# Patient Record
Sex: Female | Born: 1985
Health system: Southern US, Community
[De-identification: ages and names within clinical notes are randomized; demographics above are authoritative.]

## PROBLEM LIST (undated history)

## (undated) DIAGNOSIS — Z789 Other specified health status: Secondary | ICD-10-CM

## (undated) DIAGNOSIS — A749 Chlamydial infection, unspecified: Secondary | ICD-10-CM

## (undated) DIAGNOSIS — N390 Urinary tract infection, site not specified: Secondary | ICD-10-CM

## (undated) HISTORY — PX: THERAPEUTIC ABORTION: SHX798

---
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
## 2006-04-14 ENCOUNTER — Emergency Department (HOSPITAL_COMMUNITY): Admission: EM | Admit: 2006-04-14 | Discharge: 2006-04-15 | Payer: Self-pay | Admitting: Emergency Medicine

## 2006-07-14 ENCOUNTER — Emergency Department (HOSPITAL_COMMUNITY): Admission: EM | Admit: 2006-07-14 | Discharge: 2006-07-14 | Payer: Self-pay | Admitting: *Deleted

## 2006-10-24 ENCOUNTER — Ambulatory Visit: Payer: Self-pay | Admitting: Obstetrics & Gynecology

## 2006-10-24 ENCOUNTER — Inpatient Hospital Stay (HOSPITAL_COMMUNITY): Admission: AD | Admit: 2006-10-24 | Discharge: 2006-10-26 | Payer: Self-pay | Admitting: Obstetrics & Gynecology

## 2008-10-09 ENCOUNTER — Emergency Department (HOSPITAL_COMMUNITY): Admission: EM | Admit: 2008-10-09 | Discharge: 2008-10-09 | Payer: Self-pay | Admitting: Family Medicine

## 2008-11-05 ENCOUNTER — Encounter: Payer: Self-pay | Admitting: Family Medicine

## 2008-11-05 ENCOUNTER — Inpatient Hospital Stay (HOSPITAL_COMMUNITY): Admission: AD | Admit: 2008-11-05 | Discharge: 2008-11-05 | Payer: Self-pay | Admitting: Family Medicine

## 2008-11-26 ENCOUNTER — Ambulatory Visit (HOSPITAL_COMMUNITY): Admission: RE | Admit: 2008-11-26 | Discharge: 2008-11-26 | Payer: Self-pay | Admitting: Family Medicine

## 2009-01-21 ENCOUNTER — Inpatient Hospital Stay (HOSPITAL_COMMUNITY): Admission: AD | Admit: 2009-01-21 | Discharge: 2009-01-21 | Payer: Self-pay | Admitting: Obstetrics and Gynecology

## 2009-01-21 ENCOUNTER — Ambulatory Visit: Payer: Self-pay | Admitting: Family Medicine

## 2009-01-22 ENCOUNTER — Ambulatory Visit: Payer: Self-pay | Admitting: Advanced Practice Midwife

## 2009-01-22 ENCOUNTER — Inpatient Hospital Stay (HOSPITAL_COMMUNITY): Admission: AD | Admit: 2009-01-22 | Discharge: 2009-01-24 | Payer: Self-pay | Admitting: Obstetrics & Gynecology

## 2010-01-29 ENCOUNTER — Emergency Department (HOSPITAL_COMMUNITY)
Admission: EM | Admit: 2010-01-29 | Discharge: 2010-01-29 | Payer: Self-pay | Source: Home / Self Care | Admitting: Emergency Medicine

## 2010-01-29 NOTE — L&D Delivery Note (Signed)
Delivery Note At 11:07 AM a viable female was delivered via Vaginal, Spontaneous Delivery (Presentation: Right Occiput Anterior).  APGAR: 9, 9; weight 8 lb 3.6 oz (3731 g).   Placenta status: Intact, Spontaneous.  Cord: 3 vessels with the following complications: None.  Cord pH: not done  Anesthesia: Epidural  Episiotomy: None Lacerations: None Suture Repair: 2.0 Est. Blood Loss (mL):   Mom to postpartum.  Baby to nursery-stable.  Minerva Bluett A 09/07/2010, 11:57 AM

## 2010-04-10 LAB — URINE MICROSCOPIC-ADD ON

## 2010-04-10 LAB — URINALYSIS, ROUTINE W REFLEX MICROSCOPIC
Bilirubin Urine: NEGATIVE
Glucose, UA: NEGATIVE mg/dL
Ketones, ur: NEGATIVE mg/dL
Nitrite: NEGATIVE
Protein, ur: 30 mg/dL — AB
Specific Gravity, Urine: 1.021 (ref 1.005–1.030)
Urobilinogen, UA: 0.2 mg/dL (ref 0.0–1.0)
pH: 8.5 — ABNORMAL HIGH (ref 5.0–8.0)

## 2010-04-10 LAB — POCT I-STAT, CHEM 8
BUN: 5 mg/dL — ABNORMAL LOW (ref 6–23)
Calcium, Ion: 1.16 mmol/L (ref 1.12–1.32)
Chloride: 103 mEq/L (ref 96–112)
Creatinine, Ser: 0.7 mg/dL (ref 0.4–1.2)
Glucose, Bld: 97 mg/dL (ref 70–99)
HCT: 39 % (ref 36.0–46.0)
Hemoglobin: 13.3 g/dL (ref 12.0–15.0)
Potassium: 3.9 mEq/L (ref 3.5–5.1)
Sodium: 138 mEq/L (ref 135–145)
TCO2: 26 mmol/L (ref 0–100)

## 2010-04-10 LAB — GC/CHLAMYDIA PROBE AMP, GENITAL
Chlamydia, DNA Probe: NEGATIVE
GC Probe Amp, Genital: NEGATIVE

## 2010-04-10 LAB — WET PREP, GENITAL
Trich, Wet Prep: NONE SEEN
Yeast Wet Prep HPF POC: NONE SEEN

## 2010-04-10 LAB — ABO/RH: ABO/RH(D): B POS

## 2010-04-10 LAB — POCT PREGNANCY, URINE: Preg Test, Ur: POSITIVE

## 2010-04-10 LAB — HCG, QUANTITATIVE, PREGNANCY: hCG, Beta Chain, Quant, S: 87107 m[IU]/mL — ABNORMAL HIGH (ref <5)

## 2010-05-01 LAB — CBC
HCT: 34.8 % — ABNORMAL LOW (ref 36.0–46.0)
Hemoglobin: 11.3 g/dL — ABNORMAL LOW (ref 12.0–15.0)
MCHC: 32.5 g/dL (ref 30.0–36.0)
MCV: 82.8 fL (ref 78.0–100.0)
Platelets: 216 10*3/uL (ref 150–400)
RBC: 4.2 MIL/uL (ref 3.87–5.11)
RDW: 13.6 % (ref 11.5–15.5)
WBC: 5.3 10*3/uL (ref 4.0–10.5)

## 2010-05-01 LAB — RPR: RPR Ser Ql: NONREACTIVE

## 2010-05-04 LAB — ABO/RH: ABO/RH(D): B POS

## 2010-05-05 LAB — URINALYSIS, ROUTINE W REFLEX MICROSCOPIC
Bilirubin Urine: NEGATIVE
Glucose, UA: NEGATIVE mg/dL
Hgb urine dipstick: NEGATIVE
Ketones, ur: NEGATIVE mg/dL
Nitrite: NEGATIVE
Protein, ur: NEGATIVE mg/dL
Specific Gravity, Urine: 1.022 (ref 1.005–1.030)
Urobilinogen, UA: 0.2 mg/dL (ref 0.0–1.0)
pH: 6.5 (ref 5.0–8.0)

## 2010-05-05 LAB — URINE MICROSCOPIC-ADD ON

## 2010-05-13 ENCOUNTER — Inpatient Hospital Stay (HOSPITAL_COMMUNITY): Payer: Medicaid Other

## 2010-05-13 ENCOUNTER — Inpatient Hospital Stay (HOSPITAL_COMMUNITY)
Admission: AD | Admit: 2010-05-13 | Discharge: 2010-05-13 | Disposition: A | Discharge status: Home / Self Care | Payer: Medicaid Other | Source: Ambulatory Visit | Attending: Obstetrics and Gynecology | Admitting: Obstetrics and Gynecology

## 2010-05-13 DIAGNOSIS — N390 Urinary tract infection, site not specified: Secondary | ICD-10-CM | POA: Insufficient documentation

## 2010-05-13 DIAGNOSIS — O2390 Unspecified genitourinary tract infection in pregnancy, unspecified trimester: Secondary | ICD-10-CM | POA: Insufficient documentation

## 2010-05-13 LAB — URINALYSIS, ROUTINE W REFLEX MICROSCOPIC
Bilirubin Urine: NEGATIVE
Glucose, UA: NEGATIVE mg/dL
Ketones, ur: NEGATIVE mg/dL
Nitrite: POSITIVE — AB
Protein, ur: NEGATIVE mg/dL
Specific Gravity, Urine: 1.025 (ref 1.005–1.030)
Urobilinogen, UA: 0.2 mg/dL (ref 0.0–1.0)
pH: 7 (ref 5.0–8.0)

## 2010-05-13 LAB — URINE MICROSCOPIC-ADD ON

## 2010-05-13 LAB — WET PREP, GENITAL
Trich, Wet Prep: NONE SEEN
Yeast Wet Prep HPF POC: NONE SEEN

## 2010-05-13 LAB — POCT PREGNANCY, URINE: Preg Test, Ur: POSITIVE

## 2010-05-15 LAB — URINE CULTURE
Colony Count: >=100000
Culture  Setup Time: 201204142122

## 2010-05-16 LAB — GC/CHLAMYDIA PROBE AMP, GENITAL
Chlamydia, DNA Probe: POSITIVE — AB
GC Probe Amp, Genital: NEGATIVE

## 2010-05-23 ENCOUNTER — Other Ambulatory Visit (HOSPITAL_COMMUNITY): Payer: Self-pay | Admitting: Obstetrics

## 2010-05-23 DIAGNOSIS — Z3689 Encounter for other specified antenatal screening: Secondary | ICD-10-CM

## 2010-05-24 ENCOUNTER — Other Ambulatory Visit (HOSPITAL_COMMUNITY): Payer: Self-pay | Admitting: Obstetrics

## 2010-05-24 DIAGNOSIS — Z3689 Encounter for other specified antenatal screening: Secondary | ICD-10-CM

## 2010-05-25 ENCOUNTER — Ambulatory Visit (HOSPITAL_COMMUNITY): Payer: Self-pay

## 2010-05-29 ENCOUNTER — Ambulatory Visit (HOSPITAL_COMMUNITY)
Admission: RE | Admit: 2010-05-29 | Discharge: 2010-05-29 | Disposition: A | Discharge status: Home / Self Care | Payer: Medicaid Other | Source: Ambulatory Visit | Attending: Obstetrics | Admitting: Obstetrics

## 2010-05-29 DIAGNOSIS — O358XX0 Maternal care for other (suspected) fetal abnormality and damage, not applicable or unspecified: Secondary | ICD-10-CM | POA: Insufficient documentation

## 2010-05-29 DIAGNOSIS — Z363 Encounter for antenatal screening for malformations: Secondary | ICD-10-CM | POA: Insufficient documentation

## 2010-05-29 DIAGNOSIS — Z1389 Encounter for screening for other disorder: Secondary | ICD-10-CM | POA: Insufficient documentation

## 2010-05-29 DIAGNOSIS — Z3689 Encounter for other specified antenatal screening: Secondary | ICD-10-CM

## 2010-05-29 DIAGNOSIS — O368390 Maternal care for abnormalities of the fetal heart rate or rhythm, unspecified trimester, not applicable or unspecified: Secondary | ICD-10-CM | POA: Insufficient documentation

## 2010-05-29 LAB — STREP B DNA PROBE: GBS: NEGATIVE

## 2010-06-16 NOTE — Op Note (Signed)
Teresa Perry, Teresa Perry              ACCOUNT NO.:  [id number]   MEDICAL RECORD NO.:  [id number]          PATIENT TYPE:  INP   LOCATION:  9111                          [facility]ILITY:  [facility]   PHYSICIAN:  Charles A. Clearance Coots, M.D.DATE OF BIRTH:  [birth year]   DATE OF PROCEDURE:  01/06/2005  DATE OF DISCHARGE:                                 OPERATIVE REPORT   PREOPERATIVE DIAGNOSIS:  Arrest of descent.   POSTOPERATIVE DIAGNOSIS:  Arrest of descent.   PROCEDURE:  Primary low transverse cesarean section.   SURGEON:  Coral Ceo, M.D.   ANESTHESIA:  Epidural.   ESTIMATED BLOOD LOSS:  800 mL.   IV FLUIDS:  2,500 mL.   URINE OUTPUT:  300 mL clear.   COMPLICATIONS:  None. Foley to gravity.   FINDINGS:  Viable female at 49. Apgars of 8 at one minute and 9 at five  minutes. Weight of 8 pounds 14 ounces. Cord pH of 7.24. Normal uterus,  ovaries and fallopian tubes.   OPERATION:  The patient was brought to operating room after satisfactory  redosing of the epidural. The abdomen was prepped and draped in the usual  sterile fashion. Pfannenstiel's skin incision was made with a scalpel that  was deepened down to the fascia with a scalpel. Fascia was nicked in the  midline, and the fascial incision was extended to the left to the right with  curved Mayo scissors. The superior and inferior fascial edges were taken off  the rectus muscles with sharp dissection. The rectus muscles were bluntly  and sharply divided in midline. Peritoneum was entered digitally and was  digitally extended to the left and to the right. The bladder blade was  positioned, and the vesicouterine fold of the peritoneum above the  reflection of the urinary bladder was grasped with forceps and was incised  and undermined with Metzenbaum scissors. The incision was extended to the  left and to the right with the Metzenbaum scissors. The bladder flap was  bluntly developed, and the bladder blade was repositioned in front of  the  urinary bladder placing it well out of operative field. The uterus was then  entered transversely in the lower uterine segment with a scalpel. Cloudy  amniotic fluid was expelled. The uterine incision was extended to the left  and to the right digitally. The vertex was then brought up into the  incision, and the occiput was hyperextended and was rotated from an OP  position to an OA position into the incision but could still not completely  flex the occiput through the incision. The vacuum extractor was therefore  applied to the occiput, and the occiput was then flexed further into the  incision, and the delivery was accomplished with the aid of fundal pressure  from the assistant. Vacuum extractor was removed, and the delivery was then  completed with the aid of fundal pressure from the assistant. Infant's mouth  and nose were suctioned with the suction bulb, and the umbilical cord was  doubly clamped and cut, and infant was handed off to the nursery staff. Cord  pH and cord blood was  obtained, and the placenta was spontaneously expelled  from the uterine cavity intact. The endometrial surface was then thoroughly  debrided with a dry lap sponge. Edges of the uterine incision were grasped  with ring forceps. Uterus was closed with a continuous interlocking suture  of 0 Monocryl. Hemostasis was excellent. Pelvic cavity was thoroughly  irrigated with warm saline solution, and all clots were removed. The abdomen  was then closed as follows:  Peritoneum was closed with a continuous suture  of 2-0 Monocryl; fascia was closed with continuous suture of 0 Vicryl;  subcutaneous tissue was thoroughly irrigated with warm saline solution, and  all areas of subcutaneous bleeding were coagulated with Bovie;  skin was  then closed with a continuous subcuticular suture of 3-0 Monocryl. Sterile  bandage was applied to the incision closure. Surgical technician indicated  that all needle, sponge and  instrument counts were correct. The patient  tolerated the procedure well and was transported to recovery in satisfactory  condition.      Charles A. Clearance Coots, M.D.  Electronically Signed     CAH/MEDQ  D:  01/06/2005  T:  01/06/2005  Job:  [id number]

## 2010-06-16 NOTE — Discharge Summary (Signed)
NAMESHANTY, Teresa Perry              ACCOUNT NO.:  [id number]   MEDICAL RECORD NO.:  [id number]          PATIENT TYPE:  INP   LOCATION:  9111                          [facility]   PHYSICIAN:  Charles A. Clearance Coots, M.D.DATE OF BIRTH:  06/30/1985   DATE OF ADMISSION:  01/05/2005  DATE OF DISCHARGE:  01/09/2005                                 DISCHARGE SUMMARY   ADMITTING DIAGNOSES:  1.  [redacted] weeks gestation.  2.  Early labor.   DISCHARGE DIAGNOSES:  1.  [redacted] weeks gestation.  2.  Early labor.  3.  Status post primary low transverse cesarean section for arrest of      descent.  4.  Delivery of viable female on January 06, 2005 at [place].  Apgars of 8 at one      minute, 9 at five minutes.  Weight of 8 pounds 14 ounces.  Cord pH of      7.24.  Mother and infant discharged home in good condition.   REASON FOR ADMISSION:  25 year old G1 black female, estimated date of  confinement of January 01, 2005 presents with uterine contractions and  leaking fluid from vagina since 0930.  Patient's prenatal care was  uncomplicated.  Group B Strep negative.   PAST MEDICAL HISTORY:   SURGERY:  None.   ILLNESSES:  None.   MEDICATIONS:  Prenatal vitamins.   ALLERGIES:  No known drug allergies.   SOCIAL HISTORY:  Single.  Negative for tobacco, alcohol, or recreational  drug use.   PHYSICAL EXAMINATION:  GENERAL:  Well-nourished, well-developed, black  female in no acute distress.  VITAL SIGNS:  Temperature 97.8, pulse 78, respiratory rate 20, blood  pressure 144/68.  ABDOMEN:  Gravid, nontender.  PELVIC:  Cervix 3-4 cm dilated, 90% effaced, vertex at a -2 station.   ADMISSION LABORATORIES:  Hemoglobin 12.3, hematocrit 36.9, white blood cell  count 7200, platelets 237,000.   HOSPITAL COURSE:  Patient was admitted and progressed to full dilatation but  the vertex descended to no more than a 0 station and there was arrest of  descent after that point for greater than three hours without any  further  descent.  Patient was therefore taken for cesarean section delivery for  arrest of descent.  Primary low transverse cesarean section was performed on  January 06, 2005.  There were no intraoperative complications.  Patient's  postoperative course was uncomplicated.  She was discharged home on  postoperative day #3 in good condition.   DISCHARGE LABORATORIES:  Hemoglobin 10, hematocrit 30, white blood cell  count 11,900, platelets 184,000.   DISCHARGE DISPOSITION:   MEDICATIONS:  Tylox and ibuprofen were prescribed for pain.  Continue  prenatal vitamins.  Routine written instructions were given for discharge  after cesarean section.  Patient is to call the office for a follow-up  appointment in two weeks.      Charles A. Clearance Coots, M.D.  Electronically Signed     CAH/MEDQ  D:  01/25/2005  T:  01/25/2005  Job:  [id number]

## 2010-06-19 LAB — HIV ANTIBODY (ROUTINE TESTING W REFLEX): HIV: NONREACTIVE

## 2010-06-19 LAB — RUBELLA ANTIBODY, IGM: Rubella: IMMUNE

## 2010-06-19 LAB — ABO/RH: RH Type: POSITIVE

## 2010-06-19 LAB — RPR: RPR: NONREACTIVE

## 2010-06-19 LAB — TYPE AND SCREEN: Antibody Screen: NEGATIVE

## 2010-06-19 LAB — HEPATITIS B SURFACE ANTIGEN: Hepatitis B Surface Ag: NEGATIVE

## 2010-07-01 ENCOUNTER — Inpatient Hospital Stay (HOSPITAL_COMMUNITY)
Admission: EM | Admit: 2010-07-01 | Discharge: 2010-07-01 | Disposition: A | Discharge status: Home / Self Care | Payer: Medicaid Other | Source: Ambulatory Visit | Attending: Obstetrics | Admitting: Obstetrics

## 2010-07-01 DIAGNOSIS — O9989 Other specified diseases and conditions complicating pregnancy, childbirth and the puerperium: Secondary | ICD-10-CM

## 2010-07-01 DIAGNOSIS — O99891 Other specified diseases and conditions complicating pregnancy: Secondary | ICD-10-CM | POA: Insufficient documentation

## 2010-07-01 DIAGNOSIS — R109 Unspecified abdominal pain: Secondary | ICD-10-CM

## 2010-07-01 DIAGNOSIS — K2990 Gastroduodenitis, unspecified, without bleeding: Secondary | ICD-10-CM

## 2010-07-01 DIAGNOSIS — K2970 Gastritis, unspecified, without bleeding: Secondary | ICD-10-CM | POA: Insufficient documentation

## 2010-07-01 LAB — URINALYSIS, ROUTINE W REFLEX MICROSCOPIC
Bilirubin Urine: NEGATIVE
Glucose, UA: NEGATIVE mg/dL
Hgb urine dipstick: NEGATIVE
Ketones, ur: NEGATIVE mg/dL
Nitrite: NEGATIVE
Protein, ur: NEGATIVE mg/dL
Specific Gravity, Urine: 1.02 (ref 1.005–1.030)
Urobilinogen, UA: 0.2 mg/dL (ref 0.0–1.0)
pH: 7 (ref 5.0–8.0)

## 2010-07-01 LAB — URINE MICROSCOPIC-ADD ON

## 2010-09-06 ENCOUNTER — Encounter (HOSPITAL_COMMUNITY): Payer: Self-pay | Admitting: *Deleted

## 2010-09-06 ENCOUNTER — Inpatient Hospital Stay (HOSPITAL_COMMUNITY)
Admission: AD | Admit: 2010-09-06 | Discharge: 2010-09-06 | Disposition: A | Discharge status: Home / Self Care | Payer: Medicaid Other | Source: Ambulatory Visit | Attending: Obstetrics | Admitting: Obstetrics

## 2010-09-06 DIAGNOSIS — O4700 False labor before 37 completed weeks of gestation, unspecified trimester: Secondary | ICD-10-CM

## 2010-09-06 DIAGNOSIS — O479 False labor, unspecified: Secondary | ICD-10-CM | POA: Insufficient documentation

## 2010-09-06 HISTORY — DX: Other specified health status: Z78.9

## 2010-09-06 LAB — POCT FERN TEST: Fern Test: NEGATIVE

## 2010-09-06 NOTE — ED Notes (Addendum)
Pt c/o tooth pain also; pt thinks her "water" broke @ 2100; G5P3; [redacted] weeks gestation;

## 2010-09-06 NOTE — ED Notes (Signed)
Fern slide is negative

## 2010-09-06 NOTE — Progress Notes (Signed)
Pt states she had a gush of clear fluid at 2130-she is not wearing a pad-is not wet at this time

## 2010-09-06 NOTE — Progress Notes (Addendum)
Dr. Gaynell Face notified of pt arrival in mau for c/o ROM.  Notified of  VE and reactive strip with ctx every 5-7 min.  Notified of negative fern slide with large amount of sperm on slide. Orders received to dc home.

## 2010-09-06 NOTE — ED Provider Notes (Signed)
  Teresa Perry  is a 25 y.o. [id number] at [redacted] weeks EGA presenting with ? SROM and contractions. Reports gush of fluid tonight after intercourse. No longer leaking. No bleeding. + fetal movement. Contractions tonight every few mintues. Asked to eval for SROM by RN.   ROS: negative except as noted above  O: Filed Vitals:   09/06/10 2228  BP: 118/68  Pulse: 104  Temp: 98.1 F (36.7 C)  Resp: 18   Spec exam: +pooling of small amount of thin, mucousy fluid SVE: 3/50/-2 Fern: negative, + large amount of sperm and WBCs  EFM: reactive  RN to report to Dr. Gaynell Face for management.

## 2010-09-07 ENCOUNTER — Inpatient Hospital Stay (HOSPITAL_COMMUNITY)
Admission: AD | Admit: 2010-09-07 | Discharge: 2010-09-09 | DRG: 775 | Disposition: A | Discharge status: Home / Self Care | Payer: Medicaid Other | Source: Ambulatory Visit | Attending: Obstetrics | Admitting: Obstetrics

## 2010-09-07 ENCOUNTER — Encounter (HOSPITAL_COMMUNITY): Payer: Self-pay

## 2010-09-07 ENCOUNTER — Encounter (HOSPITAL_COMMUNITY): Payer: Self-pay | Admitting: Anesthesiology

## 2010-09-07 ENCOUNTER — Inpatient Hospital Stay (HOSPITAL_COMMUNITY): Payer: Medicaid Other | Admitting: Anesthesiology

## 2010-09-07 LAB — CBC
HCT: 33.7 % — ABNORMAL LOW (ref 36.0–46.0)
Hemoglobin: 10.6 g/dL — ABNORMAL LOW (ref 12.0–15.0)
MCH: 25.5 pg — ABNORMAL LOW (ref 26.0–34.0)
MCHC: 31.5 g/dL (ref 30.0–36.0)
MCV: 81 fL (ref 78.0–100.0)
Platelets: 208 10*3/uL (ref 150–400)
RBC: 4.16 MIL/uL (ref 3.87–5.11)
RDW: 13.9 % (ref 11.5–15.5)
WBC: 4.8 10*3/uL (ref 4.0–10.5)

## 2010-09-07 LAB — RPR: RPR Ser Ql: NONREACTIVE

## 2010-09-07 MED ORDER — DIPHENHYDRAMINE HCL 25 MG PO CAPS: 25.0000 mg | ORAL_CAPSULE | Freq: Four times a day (QID) | ORAL | Status: DC | PRN

## 2010-09-07 MED ORDER — SENNOSIDES-DOCUSATE SODIUM 8.6-50 MG PO TABS
2.0000 | ORAL_TABLET | Freq: Every day | ORAL | Status: DC
  Administered 2010-09-07 – 2010-09-08 (×2): 2 via ORAL

## 2010-09-07 MED ORDER — SODIUM CHLORIDE 0.9 % IJ SOLN: 3.0000 mL | INTRAMUSCULAR | Status: DC | PRN

## 2010-09-07 MED ORDER — FENTANYL 2.5 MCG/ML BUPIVACAINE 1/10 % EPIDURAL INFUSION (WH - ANES)
14.0000 mL/h | INTRAMUSCULAR | Status: DC
  Administered 2010-09-07: 14 mL/h via EPIDURAL
  Filled 2010-09-07 (×2): qty 60

## 2010-09-07 MED ORDER — LACTATED RINGERS IV SOLN
500.0000 mL | INTRAVENOUS | Status: DC | PRN
Start: 2010-09-07 — End: 2010-09-07
  Administered 2010-09-07: 1000 mL via INTRAVENOUS

## 2010-09-07 MED ORDER — OXYTOCIN BOLUS FROM INFUSION
500.0000 mL | Freq: Once | INTRAVENOUS | Status: DC
  Filled 2010-09-07: qty 500
  Filled 2010-09-07: qty 1000

## 2010-09-07 MED ORDER — PRENATAL PLUS 27-1 MG PO TABS
1.0000 | ORAL_TABLET | Freq: Every day | ORAL | Status: DC
  Administered 2010-09-08 – 2010-09-09 (×2): 1 via ORAL
  Filled 2010-09-07 (×2): qty 1

## 2010-09-07 MED ORDER — EPHEDRINE 5 MG/ML INJ
10.0000 mg | INTRAVENOUS | Status: DC | PRN
  Filled 2010-09-07: qty 4

## 2010-09-07 MED ORDER — OXYTOCIN 20 UNITS IN LACTATED RINGERS INFUSION - SIMPLE: 125.0000 mL/h | INTRAVENOUS | Status: DC | PRN

## 2010-09-07 MED ORDER — SIMETHICONE 80 MG PO CHEW: 80.0000 mg | CHEWABLE_TABLET | ORAL | Status: DC | PRN

## 2010-09-07 MED ORDER — IBUPROFEN 600 MG PO TABS
600.0000 mg | ORAL_TABLET | Freq: Four times a day (QID) | ORAL | Status: DC
  Administered 2010-09-07 – 2010-09-09 (×8): 600 mg via ORAL
  Filled 2010-09-07 (×9): qty 1

## 2010-09-07 MED ORDER — LACTATED RINGERS IV SOLN
500.0000 mL | Freq: Once | INTRAVENOUS | Status: AC
  Administered 2010-09-07: 500 mL via INTRAVENOUS

## 2010-09-07 MED ORDER — WITCH HAZEL-GLYCERIN EX PADS: 1.0000 "application " | MEDICATED_PAD | CUTANEOUS | Status: DC | PRN

## 2010-09-07 MED ORDER — LACTATED RINGERS IV SOLN: INTRAVENOUS | Status: DC

## 2010-09-07 MED ORDER — BENZOCAINE-MENTHOL 20-0.5 % EX AERO: 1.0000 "application " | INHALATION_SPRAY | CUTANEOUS | Status: DC | PRN

## 2010-09-07 MED ORDER — PHENYLEPHRINE 40 MCG/ML (10ML) SYRINGE FOR IV PUSH (FOR BLOOD PRESSURE SUPPORT): 80.0000 ug | PREFILLED_SYRINGE | INTRAVENOUS | Status: DC | PRN

## 2010-09-07 MED ORDER — ONDANSETRON HCL 4 MG PO TABS: 4.0000 mg | ORAL_TABLET | ORAL | Status: DC | PRN

## 2010-09-07 MED ORDER — ZOLPIDEM TARTRATE 5 MG PO TABS: 5.0000 mg | ORAL_TABLET | Freq: Every evening | ORAL | Status: DC | PRN

## 2010-09-07 MED ORDER — OXYCODONE-ACETAMINOPHEN 5-325 MG PO TABS: 2.0000 | ORAL_TABLET | ORAL | Status: DC | PRN

## 2010-09-07 MED ORDER — SODIUM CHLORIDE 0.9 % IJ SOLN: 3.0000 mL | Freq: Two times a day (BID) | INTRAMUSCULAR | Status: DC

## 2010-09-07 MED ORDER — ONDANSETRON HCL 4 MG/2ML IJ SOLN: 4.0000 mg | INTRAMUSCULAR | Status: DC | PRN

## 2010-09-07 MED ORDER — NALBUPHINE SYRINGE 5 MG/0.5 ML: 10.0000 mg | INJECTION | INTRAMUSCULAR | Status: DC | PRN

## 2010-09-07 MED ORDER — DIBUCAINE 1 % RE OINT: 1.0000 "application " | TOPICAL_OINTMENT | RECTAL | Status: DC | PRN

## 2010-09-07 MED ORDER — TETANUS-DIPHTH-ACELL PERTUSSIS 5-2.5-18.5 LF-MCG/0.5 IM SUSP
0.5000 mL | Freq: Once | INTRAMUSCULAR | Status: AC
  Administered 2010-09-08: 0.5 mL via INTRAMUSCULAR

## 2010-09-07 MED ORDER — PHENYLEPHRINE 40 MCG/ML (10ML) SYRINGE FOR IV PUSH (FOR BLOOD PRESSURE SUPPORT)
80.0000 ug | PREFILLED_SYRINGE | INTRAVENOUS | Status: DC | PRN
  Filled 2010-09-07: qty 5

## 2010-09-07 MED ORDER — EPHEDRINE 5 MG/ML INJ: 10.0000 mg | INTRAVENOUS | Status: DC | PRN

## 2010-09-07 MED ORDER — IBUPROFEN 600 MG PO TABS: 600.0000 mg | ORAL_TABLET | Freq: Four times a day (QID) | ORAL | Status: DC | PRN

## 2010-09-07 MED ORDER — FERROUS SULFATE 325 (65 FE) MG PO TABS
325.0000 mg | ORAL_TABLET | Freq: Two times a day (BID) | ORAL | Status: DC
  Administered 2010-09-08 – 2010-09-09 (×3): 325 mg via ORAL
  Filled 2010-09-07 (×3): qty 1

## 2010-09-07 MED ORDER — OXYCODONE-ACETAMINOPHEN 5-325 MG PO TABS
1.0000 | ORAL_TABLET | ORAL | Status: DC | PRN
Start: 2010-09-07 — End: 2010-09-09
  Administered 2010-09-07 – 2010-09-09 (×7): 1 via ORAL
  Filled 2010-09-07 (×8): qty 1

## 2010-09-07 MED ORDER — OXYTOCIN 20 UNITS IN LACTATED RINGERS INFUSION - SIMPLE
125.0000 mL/h | INTRAVENOUS | Status: AC
  Administered 2010-09-07: 125 mL/h via INTRAVENOUS

## 2010-09-07 MED ORDER — ONDANSETRON HCL 4 MG/2ML IJ SOLN: 4.0000 mg | Freq: Four times a day (QID) | INTRAMUSCULAR | Status: DC | PRN

## 2010-09-07 MED ORDER — ACETAMINOPHEN 325 MG PO TABS: 650.0000 mg | ORAL_TABLET | ORAL | Status: DC | PRN

## 2010-09-07 MED ORDER — LIDOCAINE HCL (PF) 1 % IJ SOLN: 30.0000 mL | INTRAMUSCULAR | Status: DC | PRN

## 2010-09-07 MED ORDER — SODIUM CHLORIDE 0.9 % IV SOLN: 250.0000 mL | INTRAVENOUS | Status: DC

## 2010-09-07 MED ORDER — DIPHENHYDRAMINE HCL 50 MG/ML IJ SOLN: 12.5000 mg | INTRAMUSCULAR | Status: DC | PRN

## 2010-09-07 MED ORDER — CITRIC ACID-SODIUM CITRATE 334-500 MG/5ML PO SOLN: 30.0000 mL | ORAL | Status: DC | PRN

## 2010-09-07 MED ORDER — FLEET ENEMA 7-19 GM/118ML RE ENEM: 1.0000 | ENEMA | RECTAL | Status: DC | PRN

## 2010-09-07 NOTE — Progress Notes (Signed)
UR Chart review completed.  

## 2010-09-07 NOTE — Anesthesia Procedure Notes (Addendum)
Epidural Patient location during procedure: OB  Staffing Anesthesiologist: Laymond Postle EDWARD  Preanesthetic Checklist Completed: patient identified, site marked, surgical consent, pre-op evaluation, timeout performed, IV checked, risks and benefits discussed and monitors and equipment checked  Epidural Patient position: sitting Prep: site prepped and draped and DuraPrep Patient monitoring: continuous pulse ox and blood pressure Approach: midline Injection technique: LOR air  Needle:  Needle type: Tuohy  Needle gauge: 17 G Needle length: 9 cm Needle insertion depth: 5 cm cm Catheter type: closed end flexible Catheter size: 19 Gauge Catheter at skin depth: 10 cm Test dose: negative  Assessment Events: blood not aspirated, injection not painful, no injection resistance, negative IV test and no paresthesia  Additional Notes Dosing of Epidural: 1st dose, Through needle...... 5mg Marcaine 2nd dose, through catheter.... epi 1:200K + Xylocaine 40 mg 3rd dose, through catheter.....epi 1:200K + Xylocaine 60 mg Each dose occurred after waiting 3 min,patient was free of IV sx; and patient exhibits no evidence of SA injection  Patient is more comfortable after epidural dosed. Please see RN's note for documentation of vital signs,and FHR which are stable.    

## 2010-09-07 NOTE — Anesthesia Preprocedure Evaluation (Signed)

## 2010-09-08 LAB — CBC
HCT: 29.9 % — ABNORMAL LOW (ref 36.0–46.0)
Hemoglobin: 9.3 g/dL — ABNORMAL LOW (ref 12.0–15.0)
MCH: 25.4 pg — ABNORMAL LOW (ref 26.0–34.0)
MCHC: 31.1 g/dL (ref 30.0–36.0)
MCV: 81.7 fL (ref 78.0–100.0)
Platelets: 200 10*3/uL (ref 150–400)
RBC: 3.66 MIL/uL — ABNORMAL LOW (ref 3.87–5.11)
RDW: 14 % (ref 11.5–15.5)
WBC: 7.1 10*3/uL (ref 4.0–10.5)

## 2010-09-08 MED ORDER — BENZOCAINE-MENTHOL 20-0.5 % EX AERO
INHALATION_SPRAY | CUTANEOUS | Status: AC
  Administered 2010-09-08: 11:00:00
  Filled 2010-09-08: qty 56

## 2010-09-08 NOTE — Anesthesia Postprocedure Evaluation (Signed)
  Anesthesia Post-op Note  Patient: Teresa Perry  This patient has recovered from her labor epidural, and I am not aware of any complications or problems.

## 2010-09-08 NOTE — Progress Notes (Signed)
  Postpartum day #1 Bile signs normal Fundus firm Lochia moderate Legs negative No complaints

## 2010-09-09 MED ORDER — IBUPROFEN 600 MG PO TABS: 600.0000 mg | ORAL_TABLET | Freq: Four times a day (QID) | ORAL | Status: DC

## 2010-09-09 NOTE — Progress Notes (Signed)
  Post Partum Day 2 S/P spontaneous vaginal RH status/Rubella reviewed.  Feeding: breast Subjective: No HA, SOB, CP, F/C, breast symptoms. Normal vaginal bleeding, no clots.     Objective: BP 113/65  Pulse 61  Temp(Src) 97.5 F (36.4 C) (Oral)  Resp 20  Ht 5\' 6"  (1.676 m)  Wt 83.462 kg (184 lb)  BMI 29.70 kg/m2  SpO2 98%  LMP 12/13/2009  Breastfeeding? Unknown I&O reviewed.   Physical Exam:  General: alert and no distress Lochia: appropriate Uterine Fundus: firm DVT Evaluation: No evidence of DVT seen on physical exam. Ext: No c/c/e  Basename 09/08/10 0507 09/07/10 0800  HGB 9.3* 10.6*  HCT 29.9* 33.7*      Assessment/Plan: 25 y.o.  PPD #2 .  normal postpartum exam Continue current postpartum care Discharge home.    LOS: 2 days   Noralyn Karim A 09/09/2010, 11:16 AM

## 2010-09-09 NOTE — Discharge Summary (Signed)
Obstetric Discharge Summary Reason for Admission: onset of labor Prenatal Procedures: ultrasound Intrapartum Procedures: spontaneous vaginal delivery Postpartum Procedures: none Complications-Operative and Postpartum: none Hemoglobin  Date Value Range Status  09/08/2010 9.3* 12.0-15.0 (g/dL) Final     HCT  Date Value Range Status  09/08/2010 29.9* 36.0-46.0 (%) Final    Discharge Diagnoses: Term Pregnancy-delivered  Discharge Information: Date: 09/09/2010 Activity: pelvic rest Diet: routine Medications: PNV and Ibuprophen Condition: stable Instructions: refer to practice specific booklet Discharge to: home Follow-up Information    Follow up with MARSHALL,BERNARD A, MD. Make an appointment in 6 weeks.   Contact information:   [address] [place] [address] [phone]          Newborn Data: Live born female  Birth Weight: 8 lb 3.6 oz (3731 g) APGAR: 9, 9  Home with mother.  Teresa Perry 09/09/2010, 11:19 AM

## 2010-09-10 ENCOUNTER — Emergency Department (HOSPITAL_COMMUNITY)
Admission: EM | Admit: 2010-09-10 | Discharge: 2010-09-10 | Disposition: A | Discharge status: Home / Self Care | Payer: Medicaid Other | Attending: Emergency Medicine | Admitting: Emergency Medicine

## 2010-09-10 DIAGNOSIS — K089 Disorder of teeth and supporting structures, unspecified: Secondary | ICD-10-CM | POA: Insufficient documentation

## 2010-09-10 DIAGNOSIS — K029 Dental caries, unspecified: Secondary | ICD-10-CM | POA: Insufficient documentation

## 2010-11-09 LAB — URINALYSIS, ROUTINE W REFLEX MICROSCOPIC
Bilirubin Urine: NEGATIVE
Glucose, UA: NEGATIVE
Hgb urine dipstick: NEGATIVE
Ketones, ur: NEGATIVE
Nitrite: NEGATIVE
Protein, ur: NEGATIVE
Specific Gravity, Urine: 1.02
Urobilinogen, UA: 0.2
pH: 8

## 2010-11-09 LAB — RPR: RPR Ser Ql: NONREACTIVE

## 2010-11-09 LAB — DIFFERENTIAL
Basophils Absolute: 0
Basophils Relative: 0
Eosinophils Absolute: 0
Eosinophils Relative: 1
Lymphocytes Relative: 19
Lymphs Abs: 1
Monocytes Absolute: 0.5
Monocytes Relative: 8
Neutro Abs: 4.1
Neutrophils Relative %: 73

## 2010-11-09 LAB — RAPID URINE DRUG SCREEN, HOSP PERFORMED
Amphetamines: NOT DETECTED
Barbiturates: NOT DETECTED
Benzodiazepines: NOT DETECTED
Cocaine: NOT DETECTED
Opiates: NOT DETECTED
Tetrahydrocannabinol: NOT DETECTED

## 2010-11-09 LAB — CBC
HCT: 31.5 — ABNORMAL LOW
HCT: 35.3 — ABNORMAL LOW
Hemoglobin: 10.6 — ABNORMAL LOW
Hemoglobin: 11.9 — ABNORMAL LOW
MCHC: 33.7
MCHC: 33.8
MCV: 84
MCV: 84.3
Platelets: 172
Platelets: 213
RBC: 3.74 — ABNORMAL LOW
RBC: 4.2
RDW: 15.2 — ABNORMAL HIGH
RDW: 15.6 — ABNORMAL HIGH
WBC: 5.6
WBC: 8.8

## 2010-11-09 LAB — GC/CHLAMYDIA PROBE AMP, GENITAL
Chlamydia, DNA Probe: NEGATIVE
GC Probe Amp, Genital: NEGATIVE

## 2010-11-09 LAB — CCBB MATERNAL DONOR DRAW

## 2010-11-09 LAB — URINE MICROSCOPIC-ADD ON

## 2010-11-09 LAB — RUBELLA SCREEN: Rubella: 103.9 — ABNORMAL HIGH

## 2010-11-09 LAB — SICKLE CELL SCREEN: Sickle Cell Screen: NEGATIVE

## 2010-11-09 LAB — TYPE AND SCREEN
ABO/RH(D): B POS
Antibody Screen: NEGATIVE

## 2010-11-09 LAB — HEPATITIS B SURFACE ANTIGEN: Hepatitis B Surface Ag: NEGATIVE

## 2010-11-09 LAB — HIV ANTIBODY (ROUTINE TESTING W REFLEX): HIV: NONREACTIVE

## 2010-11-09 LAB — RAPID HIV SCREEN (WH-MAU): Rapid HIV Screen: NONREACTIVE

## 2010-11-09 LAB — ABO/RH: ABO/RH(D): B POS

## 2010-11-15 LAB — GC/CHLAMYDIA PROBE AMP, GENITAL
Chlamydia, DNA Probe: NEGATIVE
GC Probe Amp, Genital: POSITIVE — AB

## 2010-11-15 LAB — WET PREP, GENITAL
Clue Cells Wet Prep HPF POC: NONE SEEN
Trich, Wet Prep: NONE SEEN
Yeast Wet Prep HPF POC: NONE SEEN

## 2010-11-15 LAB — RPR: RPR Ser Ql: NONREACTIVE

## 2011-07-29 ENCOUNTER — Encounter (HOSPITAL_COMMUNITY): Payer: Self-pay | Admitting: Emergency Medicine

## 2011-07-29 ENCOUNTER — Emergency Department (HOSPITAL_COMMUNITY)
Admission: EM | Admit: 2011-07-29 | Discharge: 2011-07-29 | Disposition: A | Discharge status: Home / Self Care | Payer: Self-pay | Attending: Emergency Medicine | Admitting: Emergency Medicine

## 2011-07-29 DIAGNOSIS — K029 Dental caries, unspecified: Secondary | ICD-10-CM | POA: Insufficient documentation

## 2011-07-29 DIAGNOSIS — K0889 Other specified disorders of teeth and supporting structures: Secondary | ICD-10-CM

## 2011-07-29 DIAGNOSIS — Z87891 Personal history of nicotine dependence: Secondary | ICD-10-CM | POA: Insufficient documentation

## 2011-07-29 MED ORDER — ACETAMINOPHEN-CODEINE #3 300-30 MG PO TABS
2.0000 | ORAL_TABLET | Freq: Once | ORAL | Status: AC
  Administered 2011-07-29: 2 via ORAL
  Filled 2011-07-29 (×2): qty 1

## 2011-07-29 MED ORDER — IBUPROFEN 800 MG PO TABS: 800.0000 mg | ORAL_TABLET | Freq: Three times a day (TID) | ORAL | Status: DC

## 2011-07-29 MED ORDER — ACETAMINOPHEN-CODEINE #3 300-30 MG PO TABS: 1.0000 | ORAL_TABLET | Freq: Four times a day (QID) | ORAL | Status: AC | PRN

## 2011-07-29 MED ORDER — PENICILLIN V POTASSIUM 500 MG PO TABS: 500.0000 mg | ORAL_TABLET | Freq: Three times a day (TID) | ORAL | Status: AC

## 2011-07-29 NOTE — ED Notes (Signed)
Pt reports Left lower tooth pain onset x 2 days no facial edema noted

## 2011-07-29 NOTE — Discharge Instructions (Signed)
Apply warm compresses to jaw throughout the day. Take antibiotic and completion. Take Tylenol #3 as directed, as needed for pain but do not drive or operate machinery with pain medication use. Alternate with ibuprofen for additional relief. Followup with a dentist is very important for ongoing evaluation and management of recurrent dental pain. However return to emergency department for emergent changing or worsening symptoms.   Dental Pain A tooth ache may be caused by cavities (tooth decay). Cavities expose the nerve of the tooth to air and hot or cold temperatures. It may come from an infection or abscess (also called a boil or furuncle) around your tooth. It is also often caused by dental caries (tooth decay). This causes the pain you are having. DIAGNOSIS  Your caregiver can diagnose this problem by exam. TREATMENT   If caused by an infection, it may be treated with medications which kill germs (antibiotics) and pain medications as prescribed by your caregiver. Take medications as directed.   Only take over-the-counter or prescription medicines for pain, discomfort, or fever as directed by your caregiver.   Whether the tooth ache today is caused by infection or dental disease, you should see your dentist as soon as possible for further care.  SEEK MEDICAL CARE IF: The exam and treatment you received today has been provided on an emergency basis only. This is not a substitute for complete medical or dental care. If your problem worsens or new problems (symptoms) appear, and you are unable to meet with your dentist, call or return to this location. SEEK IMMEDIATE MEDICAL CARE IF:   You have a fever.   You develop redness and swelling of your face, jaw, or neck.   You are unable to open your mouth.   You have severe pain uncontrolled by pain medicine.  MAKE SURE YOU:   Understand these instructions.   Will watch your condition.   Will get help right away if you are not doing well or  get worse.  Document Released: 01/15/2005 Document Revised: 01/04/2011 Document Reviewed: 09/03/2007 [facility] Patient Information 2012 [place], [place].

## 2011-07-29 NOTE — ED Provider Notes (Signed)
Medical screening examination/treatment/procedure(s) were performed by non-physician practitioner and as supervising physician I was immediately available for consultation/collaboration.   Joya Gaskins, MD 07/29/11 [phone]

## 2011-07-29 NOTE — ED Provider Notes (Signed)
History     CSN: [id number]  Arrival date & time 07/29/11  0610   First MD Initiated Contact with Patient 07/29/11 [phone]      Chief Complaint  Patient presents with  . Dental Pain    left lower moller    (Consider location/radiation/quality/duration/timing/severity/associated sxs/prior treatment) HPI  Patient presents to ER complaining of a few day hx of gradual onset left lower dental pain associated with tooth with known dental decay. Patient states she has been taking ibuprofen without relief of symptoms. Denies fevers, chills, facial swelling, difficulty breathing or swallowing. Denies aggravating or alleviating factors. Has seen a dentist in the past but not recently due to loosing dental insurance.   Past Medical History  Diagnosis Date  . No pertinent past medical history   . NVD (normal vaginal delivery) 09/07/2010    Past Surgical History  Procedure Date  . Cesarean section     History reviewed. No pertinent family history.  History  Substance Use Topics  . Smoking status: Former Games developer  . Smokeless tobacco: Never Used  . Alcohol Use: No    OB History    Grav Para Term Preterm Abortions TAB SAB Ect Mult Living   5 4 4  1 1    4       Review of Systems  All other systems reviewed and are negative.    Allergies  Review of patient's allergies indicates no known allergies.  Home Medications   Current Outpatient Rx  Name Route Sig Dispense Refill  . IBUPROFEN 200 MG PO TABS Oral Take 200-800 mg by mouth every 6 (six) hours as needed. Headache or pain    . ACETAMINOPHEN-CODEINE #3 300-30 MG PO TABS Oral Take 1-2 tablets by mouth every 6 (six) hours as needed for pain. 15 tablet 0  . IBUPROFEN 600 MG PO TABS Oral Take 1 tablet (600 mg total) by mouth every 6 (six) hours. 30 tablet 5  . IBUPROFEN 800 MG PO TABS Oral Take 1 tablet (800 mg total) by mouth 3 (three) times daily. 21 tablet 0  . PENICILLIN V POTASSIUM 500 MG PO TABS Oral Take 1 tablet (500 mg  total) by mouth 3 (three) times daily. 30 tablet 0    BP 130/78  Pulse 115  Temp 98.5 F (36.9 C) (Oral)  Resp 18  SpO2 98%  Breastfeeding? Unknown  Physical Exam  Nursing note and vitals reviewed. Constitutional: She is oriented to person, place, and time. She appears well-developed and well-nourished.  HENT:  Head: Normocephalic and atraumatic.       dental decay of left lower molars with TTP of surrounding gingiva but no gingival mass or fluctuance.   Patent airway.   Eyes: Conjunctivae are normal.  Neck: Normal range of motion. Neck supple.  Cardiovascular: Normal rate and regular rhythm.   Pulmonary/Chest: Effort normal and breath sounds normal.  Musculoskeletal: Normal range of motion.  Lymphadenopathy:    She has no cervical adenopathy.  Neurological: She is alert and oriented to person, place, and time.  Skin: Skin is warm and dry.  Psychiatric: She has a normal mood and affect. Her behavior is normal.    ED Course  Procedures (including critical care time)  PO tylenol #3  Labs Reviewed - No data to display No results found.   1. Pain, dental   2. Dental caries       MDM  Dental pain associated with dental cary but no signs or symptoms of dental abscess with  patient afebrile, non toxic appearing and swallowing secretions well. I gave patient referral to dentist and stressed the importance of dental follow up for ultimate management of dental pain. Patient voices understanding and is agreeable to plan.         [place], [place] 07/29/11 [phone]

## 2011-08-17 ENCOUNTER — Encounter (HOSPITAL_COMMUNITY): Payer: Self-pay | Admitting: *Deleted

## 2011-08-17 DIAGNOSIS — K089 Disorder of teeth and supporting structures, unspecified: Secondary | ICD-10-CM | POA: Insufficient documentation

## 2011-08-17 NOTE — ED Notes (Signed)
Toothache for 2 days 

## 2011-08-18 ENCOUNTER — Emergency Department (HOSPITAL_COMMUNITY)
Admission: EM | Admit: 2011-08-18 | Discharge: 2011-08-18 | Disposition: A | Discharge status: Home / Self Care | Payer: Self-pay | Attending: Emergency Medicine | Admitting: Emergency Medicine

## 2011-08-18 DIAGNOSIS — K0889 Other specified disorders of teeth and supporting structures: Secondary | ICD-10-CM

## 2011-08-18 MED ORDER — TRAMADOL HCL 50 MG PO TABS: 50.0000 mg | ORAL_TABLET | Freq: Four times a day (QID) | ORAL | Status: AC | PRN

## 2011-08-18 MED ORDER — TRAMADOL HCL 50 MG PO TABS
50.0000 mg | ORAL_TABLET | Freq: Once | ORAL | Status: AC
  Administered 2011-08-18: 50 mg via ORAL
  Filled 2011-08-18: qty 1

## 2011-08-18 NOTE — ED Provider Notes (Signed)
History     CSN: [id number]  Arrival date & time 08/17/11  2329   None     Chief Complaint  Patient presents with  . Dental Pain    (Consider location/radiation/quality/duration/timing/severity/associated sxs/prior treatment) HPI Comments: Patient with persistent pain from a cracked tooth.  She was seen here 3 weeks ago for the same tooth pain.  She did not follow through with a dentist if encouraged her to do this.  Following our evaluation.  Tonight.  She has been taking over-the-counter ibuprofen, without relief  Patient is a 26 y.o. female presenting with tooth pain. The history is provided by the patient.  Dental PainThe primary symptoms include mouth pain. Primary symptoms do not include headaches or fever.    Past Medical History  Diagnosis Date  . No pertinent past medical history   . NVD (normal vaginal delivery) 09/07/2010    Past Surgical History  Procedure Date  . Cesarean section     No family history on file.  History  Substance Use Topics  . Smoking status: Former Games developer  . Smokeless tobacco: Never Used  . Alcohol Use: No    OB History    Grav Para Term Preterm Abortions TAB SAB Ect Mult Living   5 4 4  1 1    4       Review of Systems  Constitutional: Negative for fever and chills.  HENT: Positive for dental problem.   Neurological: Negative for dizziness and headaches.    Allergies  Review of patient's allergies indicates no known allergies.  Home Medications   Current Outpatient Rx  Name Route Sig Dispense Refill  . IBUPROFEN 200 MG PO TABS Oral Take 200-800 mg by mouth every 6 (six) hours as needed. Headache or pain      BP 107/62  Pulse 63  Temp 98.5 F (36.9 C) (Oral)  Resp 14  SpO2 100%  Breastfeeding? Unknown  Physical Exam  Constitutional: She appears well-developed and well-nourished.  HENT:  Head: Normocephalic.  Mouth/Throat:    Eyes: Pupils are equal, round, and reactive to light.  Neck: Normal range of motion.     ED Course  Dental Date/Time: 08/18/2011 12:29 AM Performed by: Arman Filter Authorized by: Arman Filter Consent: Verbal consent obtained. Risks and benefits: risks, benefits and alternatives were discussed Consent given by: patient Patient understanding: patient states understanding of the procedure being performed Patient identity confirmed: verbally with patient Time out: Immediately prior to procedure a "time out" was called to verify the correct patient, procedure, equipment, support staff and site/side marked as required. Local anesthesia used: yes Anesthesia: local infiltration Local anesthetic: bupivacaine 0.5% without epinephrine Anesthetic total: 2 ml Patient sedated: no Patient tolerance: Patient tolerated the procedure well with no immediate complications.   (including critical care time)  Labs Reviewed - No data to display No results found.   No diagnosis found.    MDM   Dental pain, and due to broken tooth.  I performed a dental block with relief of her pain, discharge, her home with [employer], and again, the role to dentist        Arman Filter, NP 08/22/11 1954

## 2011-08-18 NOTE — ED Notes (Signed)
Pt ambulated with a steady gait;VSS; A&Ox3; no signs of distress; respirations even and unlabored; skin warm and dry; no questions at this time.  

## 2011-09-01 NOTE — ED Provider Notes (Signed)
Medical screening examination/treatment/procedure(s) were performed by non-physician practitioner and as supervising physician I was immediately available for consultation/collaboration.  Sunnie Nielsen, MD 09/01/11 2229

## 2011-09-24 ENCOUNTER — Encounter (HOSPITAL_COMMUNITY): Payer: Self-pay | Admitting: *Deleted

## 2011-09-24 ENCOUNTER — Inpatient Hospital Stay (HOSPITAL_COMMUNITY)
Admission: AD | Admit: 2011-09-24 | Discharge: 2011-09-24 | Disposition: A | Discharge status: Home / Self Care | Payer: Self-pay | Source: Ambulatory Visit | Attending: Obstetrics & Gynecology | Admitting: Obstetrics & Gynecology

## 2011-09-24 ENCOUNTER — Inpatient Hospital Stay (HOSPITAL_COMMUNITY): Payer: Self-pay

## 2011-09-24 DIAGNOSIS — Z2089 Contact with and (suspected) exposure to other communicable diseases: Secondary | ICD-10-CM

## 2011-09-24 DIAGNOSIS — Z202 Contact with and (suspected) exposure to infections with a predominantly sexual mode of transmission: Secondary | ICD-10-CM | POA: Insufficient documentation

## 2011-09-24 DIAGNOSIS — IMO0002 Reserved for concepts with insufficient information to code with codable children: Secondary | ICD-10-CM | POA: Insufficient documentation

## 2011-09-24 DIAGNOSIS — O034 Incomplete spontaneous abortion without complication: Secondary | ICD-10-CM

## 2011-09-24 HISTORY — DX: Urinary tract infection, site not specified: N39.0

## 2011-09-24 HISTORY — DX: Chlamydial infection, unspecified: A74.9

## 2011-09-24 LAB — HCG, QUANTITATIVE, PREGNANCY: hCG, Beta Chain, Quant, S: 2455 m[IU]/mL — ABNORMAL HIGH (ref <5)

## 2011-09-24 LAB — WET PREP, GENITAL
Clue Cells Wet Prep HPF POC: NONE SEEN
Trich, Wet Prep: NONE SEEN
Yeast Wet Prep HPF POC: NONE SEEN

## 2011-09-24 LAB — CBC
HCT: 34 % — ABNORMAL LOW (ref 36.0–46.0)
Hemoglobin: 10.7 g/dL — ABNORMAL LOW (ref 12.0–15.0)
MCH: 25.7 pg — ABNORMAL LOW (ref 26.0–34.0)
MCHC: 31.5 g/dL (ref 30.0–36.0)
MCV: 81.7 fL (ref 78.0–100.0)
Platelets: 237 10*3/uL (ref 150–400)
RBC: 4.16 MIL/uL (ref 3.87–5.11)
RDW: 13.8 % (ref 11.5–15.5)
WBC: 4.6 10*3/uL (ref 4.0–10.5)

## 2011-09-24 LAB — POCT PREGNANCY, URINE: Preg Test, Ur: POSITIVE — AB

## 2011-09-24 MED ORDER — CEFTRIAXONE SODIUM 250 MG IJ SOLR
250.0000 mg | Freq: Once | INTRAMUSCULAR | Status: AC
  Administered 2011-09-24: 250 mg via INTRAMUSCULAR
  Filled 2011-09-24: qty 250

## 2011-09-24 MED ORDER — MISOPROSTOL 200 MCG PO TABS: ORAL_TABLET | ORAL | Status: DC

## 2011-09-24 MED ORDER — AZITHROMYCIN 250 MG PO TABS
1000.0000 mg | ORAL_TABLET | Freq: Once | ORAL | Status: AC
  Administered 2011-09-24: 1000 mg via ORAL
  Filled 2011-09-24: qty 4

## 2011-09-24 NOTE — MAU Note (Signed)
Pt. States she had abortion at [facility] in [facility] 09/13/11. Did well initially, but this morning started having a small amount of bleeding. Then when she got in the shower, "blood started pouring like water with lots of blood clots." States she did not call [facility]. States also she would like to be tested for chlamydia because her boyfriend tested positive last week. Minimal bleeding noted at current time.

## 2011-09-24 NOTE — MAU Provider Note (Signed)
History     CSN: [id number]  Arrival date and time: 09/24/11 2440   First Provider Initiated Contact with Patient 09/24/11 [phone]      Chief Complaint  Patient presents with  . Vaginal Bleeding   HPI Teresa Perry is 26 y.o. [id number] Unknown weeks presenting with heavy bleeding that began this am.  Describes as heavy, bright red with clots. Cramping, rates it as 4/10 and intermittent.  Filled 4 pads this am.  Hx of recent abortion 8/15 at [facility] in [facility].  She reports she was [redacted] weeks pregnant at that time.  Is on oral contraceptive pills now and has appt for Dept on 9/13.  Asking for STD testing.  Boyfriend reported + Chlamydia to her yesterday.      Past Medical History  Diagnosis Date  . No pertinent past medical history   . NVD (normal vaginal delivery) 09/07/2010  . Chlamydia   . Urinary tract infection     Past Surgical History  Procedure Date  . Cesarean section   . Therapeutic abortion     History reviewed. No pertinent family history.  History  Substance Use Topics  . Smoking status: Current Everyday Smoker -- 0.2 packs/day  . Smokeless tobacco: Never Used  . Alcohol Use: Yes     Occas.    Allergies: No Known Allergies  Prescriptions prior to admission  Medication Sig Dispense Refill  . ibuprofen (ADVIL,MOTRIN) 200 MG tablet Take 200-800 mg by mouth every 6 (six) hours as needed. Headache or pain        Review of Systems  Constitutional: Negative.   HENT: Negative.   Respiratory: Negative.   Cardiovascular: Negative.   Gastrointestinal: Positive for abdominal pain (cramping).  Genitourinary:       + for vaginal bleeding + for chlamydia exposure   Physical Exam   Blood pressure 111/70, pulse 95, temperature 98.6 F (37 C), temperature source Oral, resp. rate 20, height 5\' 5"  (1.651 m), weight 77.111 kg (170 lb), last menstrual period 07/08/2011, SpO2 100.00%, unknown if currently breastfeeding.  Physical Exam  Constitutional: She  is oriented to person, place, and time. She appears well-developed and well-nourished. No distress.  HENT:  Head: Normocephalic.  Neck: Normal range of motion.  Cardiovascular: Normal rate.   Respiratory: Effort normal.  GI: Soft. She exhibits no distension and no mass. There is no tenderness. There is no rebound and no guarding.  Genitourinary: Uterus is enlarged (slightly). Uterus is not tender. Cervix exhibits no motion tenderness. Right adnexum displays no mass, no tenderness and no fullness. Left adnexum displays no mass, no tenderness and no fullness. There is bleeding (moderate amount of bright red blood without clot) around the vagina.  Neurological: She is alert and oriented to person, place, and time.  Skin: Skin is warm and dry.  Psychiatric: She has a normal mood and affect. Her behavior is normal.   Results for orders placed during the hospital encounter of 09/24/11 (from the past 24 hour(s))  CBC     Status: Abnormal   Collection Time   09/24/11  9:17 AM      Component Value Range   WBC 4.6  4.0 - 10.5 K/uL   RBC 4.16  3.87 - 5.11 MIL/uL   Hemoglobin 10.7 (*) 12.0 - 15.0 g/dL   HCT 03.4 (*) 74.2 - 59.5 %   MCV 81.7  78.0 - 100.0 fL   MCH 25.7 (*) 26.0 - 34.0 pg   MCHC 31.5  30.0 - 36.0 g/dL   RDW 45.4  09.8 - 11.9 %   Platelets 237  150 - 400 K/uL  WET PREP, GENITAL     Status: Abnormal   Collection Time   09/24/11  9:40 AM      Component Value Range   Yeast Wet Prep HPF POC NONE SEEN  NONE SEEN   Trich, Wet Prep NONE SEEN  NONE SEEN   Clue Cells Wet Prep HPF POC NONE SEEN  NONE SEEN   WBC, Wet Prep HPF POC FEW (*) NONE SEEN  POCT PREGNANCY, URINE     Status: Abnormal   Collection Time   09/24/11  9:52 AM      Component Value Range   Preg Test, Ur POSITIVE (*) NEGATIVE       *RADIOLOGY REPORT*  Clinical Data: Bleeding. 10 days post abortion  TRANSABDOMINAL AND TRANSVAGINAL ULTRASOUND OF PELVIS  Technique: Both transabdominal and transvaginal ultrasound    examinations of the pelvis were performed. Transabdominal technique  was performed for global imaging of the pelvis including uterus,  ovaries, adnexal regions, and pelvic cul-de-sac.  It was necessary to proceed with endovaginal exam following the  transabdominal exam to visualize the endometrium.  Comparison: Obstetric ultrasound 05/29/2010  Findings:  Uterus: Measures 10.8 x 5.4 x 6.3 cm and has normal appearances.  Anteverted.  Endometrium: The endometrium is thickened (measures 1.7 cm),  echogenic, and heterogeneous. The thickened portion the  endometrium is along the anterior endometral wall, at the level of  the lower fundus and extending along the uterine body. Vascular  flow is demonstrated within the thickened portion of the  endometriumon Doppler imaging. This is suspicious for retained  products of conception.  Additionally, there is a small to moderate amount of fluid within  the endometrial cavity. The maximum thickness of the endometrial  cavity, including the fluid, is approximately 1.9 cm.  Right ovary: Normal appearance/no adnexal mass.  Left ovary: Normal appearance/no adnexal mass.  Other findings: No free fluid.  IMPRESSION:  1. Sonographic findings highly suggestive of retained products of  conception within the anterior uterine body/lower uterine fundus.  2. Fluid within the endometrial cavity.  Original Report Authenticated By: Britta Mccreedy, M.D.     MAU Course  Procedures  GC/CHL to lab  MDM Patient was treated for + chlamydia with Zithromax 1gram po and Rocephin 250mg  IM given in MAU.   11:20  MSE, exam and lab findings discussed with Dr. Marice Potter.  Order given for Cytotec 4 tabs at hs and follow up in GYN CLINIC is 2 weeks 11:25  Went in to discuss plan of care with the patient and she understands need for medication.  Risks and benefits explained.  She has an appointment on 9/9 with the clinic who performed the procedure and wants to keep the follow up  appointment with them and not have one at our GYN CLINIC  Assessment and Plan  A:  Recent abortion at 9w gestation with return of heavy vaginal bleeding      Retained products by ultrasound.      Chlamydia exposure--treated at time of visit  P:  Cytotec 4tabs intravaginally for hs tonight     Follow up in GYN CLINIC in 2 weeks--Patient prefers to keep appointment she has at the clinic that performed the procedure for Sept 9th.  Stressed importance of followup.    Return to MAU for severe pain or heavy bleeding. Konor Noren,EVE M 09/24/2011, 9:23 AM

## 2011-09-24 NOTE — MAU Note (Signed)
Had an abortion about 2 wks ago, had been bleeding but was slowing down becoming brown.  Today woke up in a puddle of blood. Lots of clot, blood continues to flow. ([facility] in [place])

## 2011-09-25 LAB — GC/CHLAMYDIA PROBE AMP, GENITAL
Chlamydia, DNA Probe: POSITIVE — AB
GC Probe Amp, Genital: NEGATIVE

## 2011-09-26 ENCOUNTER — Telehealth: Payer: Self-pay | Admitting: Obstetrics and Gynecology

## 2011-09-26 NOTE — Telephone Encounter (Signed)
Called patient to notify of positive chlamydia. Patient states she has been treated at time of visit in MAU. NOte from d/c states patient received zithromax and rocephin shot. Patient satisfied.

## 2011-09-26 NOTE — Telephone Encounter (Signed)
Message copied by Toula Moos on Wed Sep 26, 2011  1:54 PM ------      Message from: Darrel Hoover      Created: Wed Sep 26, 2011  8:31 AM       Please take care of this.      ----- Message -----         From: Lab In [place] Interface         Sent: 09/25/2011   4:47 PM           To: Stoney Bang Results

## 2011-11-29 ENCOUNTER — Inpatient Hospital Stay (HOSPITAL_COMMUNITY)
Admission: AD | Admit: 2011-11-29 | Discharge: 2011-11-29 | Disposition: A | Discharge status: Home / Self Care | Payer: Self-pay | Source: Ambulatory Visit | Attending: Obstetrics & Gynecology | Admitting: Obstetrics & Gynecology

## 2011-11-29 ENCOUNTER — Encounter (HOSPITAL_COMMUNITY): Payer: Self-pay | Admitting: *Deleted

## 2011-11-29 DIAGNOSIS — N925 Other specified irregular menstruation: Secondary | ICD-10-CM | POA: Insufficient documentation

## 2011-11-29 DIAGNOSIS — N938 Other specified abnormal uterine and vaginal bleeding: Secondary | ICD-10-CM | POA: Insufficient documentation

## 2011-11-29 DIAGNOSIS — D509 Iron deficiency anemia, unspecified: Secondary | ICD-10-CM

## 2011-11-29 DIAGNOSIS — D649 Anemia, unspecified: Secondary | ICD-10-CM | POA: Insufficient documentation

## 2011-11-29 DIAGNOSIS — N939 Abnormal uterine and vaginal bleeding, unspecified: Secondary | ICD-10-CM

## 2011-11-29 DIAGNOSIS — N898 Other specified noninflammatory disorders of vagina: Secondary | ICD-10-CM

## 2011-11-29 DIAGNOSIS — N949 Unspecified condition associated with female genital organs and menstrual cycle: Secondary | ICD-10-CM | POA: Insufficient documentation

## 2011-11-29 LAB — URINALYSIS, ROUTINE W REFLEX MICROSCOPIC
Bilirubin Urine: NEGATIVE
Glucose, UA: 100 mg/dL — AB
Ketones, ur: 15 mg/dL — AB
Nitrite: POSITIVE — AB
Protein, ur: >300 mg/dL — AB
Specific Gravity, Urine: 1.02 (ref 1.005–1.030)
Urobilinogen, UA: 1 mg/dL (ref 0.0–1.0)
pH: 7 (ref 5.0–8.0)

## 2011-11-29 LAB — CBC
HCT: 29.8 % — ABNORMAL LOW (ref 36.0–46.0)
Hemoglobin: 9.3 g/dL — ABNORMAL LOW (ref 12.0–15.0)
MCH: 25 pg — ABNORMAL LOW (ref 26.0–34.0)
MCHC: 31.2 g/dL (ref 30.0–36.0)
MCV: 80.1 fL (ref 78.0–100.0)
Platelets: 253 10*3/uL (ref 150–400)
RBC: 3.72 MIL/uL — ABNORMAL LOW (ref 3.87–5.11)
RDW: 13.8 % (ref 11.5–15.5)
WBC: 5.3 10*3/uL (ref 4.0–10.5)

## 2011-11-29 LAB — URINE MICROSCOPIC-ADD ON

## 2011-11-29 LAB — POCT PREGNANCY, URINE: Preg Test, Ur: NEGATIVE

## 2011-11-29 MED ORDER — MEDROXYPROGESTERONE ACETATE 150 MG/ML IM SUSP
150.0000 mg | Freq: Once | INTRAMUSCULAR | Status: AC
  Administered 2011-11-29: 150 mg via INTRAMUSCULAR
  Filled 2011-11-29: qty 1

## 2011-11-29 NOTE — MAU Provider Note (Signed)
CC: Vaginal Bleeding and Abdominal Pain    Initial contact at 2040     HPI Teresa Perry ia a 26 y.o. [id number] who presents with 1 day history of Heavy bleeding with large clots. She had an abortion 09/13/2011 and was seen here 09/24/2011 with positive Chlamydia culture. At her followup visit in [place] she was started on the OCPs which she began 10/08/2011. She has missed several pills in September and October including missing 2 consecutive days before this bleeding episode. About 2 weeks ago took Plan B morning-after pills. Has not had intercourse since then. LMP 11/11/11.   Past Medical History  Diagnosis Date  . No pertinent past medical history   . NVD (normal vaginal delivery) 09/07/2010  . Chlamydia   . Urinary tract infection     OB History    Grav Para Term Preterm Abortions TAB SAB Ect Mult Living   5 4 4  1 1    4      # Outc Date GA Lbr Len/2nd Wgt Sex Del Anes PTL Lv   1 TRM 8/12 [redacted]w[redacted]d 15:50 / 00:17 8lb3.6oz(3.731kg) F SVD EPI  Yes   Comments: none   2 TAB            3 TRM            4 TRM            5 TRM               Past Surgical History  Procedure Date  . Cesarean section   . Therapeutic abortion     History   Social History  . Marital Status: Single    Spouse Name: N/A    Number of Children: N/A  . Years of Education: N/A   Occupational History  . Not on file.   Social History Main Topics  . Smoking status: Current Every Day Smoker -- 0.2 packs/day  . Smokeless tobacco: Never Used  . Alcohol Use: Yes     Occas.  . Drug Use: No  . Sexually Active: Yes    Birth Control/ Protection: Pill     States was not using BCP correctly. Planning to get Depo 9/ 13/13   Other Topics Concern  . Not on file   Social History Narrative  . No narrative on file    No current facility-administered medications on file prior to encounter.   Current Outpatient Prescriptions on File Prior to Encounter  Medication Sig Dispense Refill  . acetaminophen  (TYLENOL) 500 MG tablet Take 1,000 mg by mouth daily as needed. For pain      . amoxicillin (AMOXIL) 500 MG capsule Take 500 mg by mouth 2 (two) times daily. Pt started on 09/13/2011  for 5 days last dose on 09/18/2011/      . ibuprofen (ADVIL,MOTRIN) 200 MG tablet Take 200-800 mg by mouth every 6 (six) hours as needed. Headache or pain      . misoprostol (CYTOTEC) 200 MCG tablet Place all 4 tabs in the vaginal tonight at bedtime.  4 tablet  0    No Known Allergies  ROS Pertinent items in HPI  PHYSICAL EXAM General: Well nourished, well developed female in no acute distress Cardiovascular: Normal rate Respiratory: Normal effort Abdomen: Soft, nontender Back: No CVAT Extremities: No edema Neurologic: Alert and oriented  Speculum exam: NEFG; vagina with physiologic discharge, heavy bleeding with clots ; cervix clean Bimanual exam: cervix closed, no CMT; uterus NSSP; no adnexal tenderness  or masses  LAB RESULTS Results for orders placed during the hospital encounter of 11/29/11 (from the past 24 hour(s))  URINALYSIS, ROUTINE W REFLEX MICROSCOPIC     Status: Abnormal   Collection Time   11/29/11  8:30 PM      Component Value Range   Color, Urine RED (*) YELLOW   APPearance TURBID (*) CLEAR   Specific Gravity, Urine 1.020  1.005 - 1.030   pH 7.0  5.0 - 8.0   Glucose, UA 100 (*) NEGATIVE mg/dL   Hgb urine dipstick LARGE (*) NEGATIVE   Bilirubin Urine NEGATIVE  NEGATIVE   Ketones, ur 15 (*) NEGATIVE mg/dL   Protein, ur >161 (*) NEGATIVE mg/dL   Urobilinogen, UA 1.0  0.0 - 1.0 mg/dL   Nitrite POSITIVE (*) NEGATIVE   Leukocytes, UA MODERATE (*) NEGATIVE  URINE MICROSCOPIC-ADD ON     Status: Normal   Collection Time   11/29/11  8:30 PM      Component Value Range   RBC / HPF TOO NUMEROUS TO COUNT  <3 RBC/hpf   Urine-Other URINALYSIS PERFORMED ON SUPERNATANT    POCT PREGNANCY, URINE     Status: Normal   Collection Time   11/29/11  8:38 PM      Component Value Range   Preg Test,  Ur NEGATIVE  NEGATIVE  CBC     Status: Abnormal   Collection Time   11/29/11  9:05 PM      Component Value Range   WBC 5.3  4.0 - 10.5 K/uL   RBC 3.72 (*) 3.87 - 5.11 MIL/uL   Hemoglobin 9.3 (*) 12.0 - 15.0 g/dL   HCT 09.6 (*) 04.5 - 40.9 %   MCV 80.1  78.0 - 100.0 fL   MCH 25.0 (*) 26.0 - 34.0 pg   MCHC 31.2  30.0 - 36.0 g/dL   RDW 81.1  91.4 - 78.2 %   Platelets 253  150 - 400 K/uL      MAU COURSE D/W Dr. Erin Fulling - will give Depoprovera  GC/CT sent for [facility] Urine culture sent  ASSESSMENT 1. Vagina bleeding   2. Anemia, iron deficiency   [id number] with abnormal withdrawal bleeding due to improper hormone use  PLAN Discharge home. See AVS for patient education GYN Clinic for Depoprovera in 3 months Take iron tablet  bid   Medication List     As of 11/29/2011 10:59 PM         Tammie Ellsworth Colin Mulders, CNM 11/29/2011 8:35 PM

## 2011-11-29 NOTE — MAU Note (Signed)
Pt LMP 11/11/2011, today having heavy bleeding and abd cramping.  Takes oral birth control and forgot to take x 2 days.

## 2011-12-01 LAB — URINE CULTURE
Colony Count: 4000
Special Requests: NORMAL

## 2011-12-03 NOTE — MAU Provider Note (Signed)
Attestation of Attending Supervision of Advanced Practitioner (CNM/NP): Evaluation and management procedures were performed by the Advanced Practitioner under my supervision and collaboration.  I have reviewed the Advanced Practitioner's note and chart, and I agree with the management and plan.  Teresa Perry, Teresa Perry 1:13 PM     

## 2011-12-28 IMAGING — US US OB DETAIL+14 WK
1 series · 14 of 28 positions shown · non-contrast
Comparison: none

[Series 1: us ob detail+14 wk · 0.23mm/px · 14 of 84 slices shown]
[im 4/84]
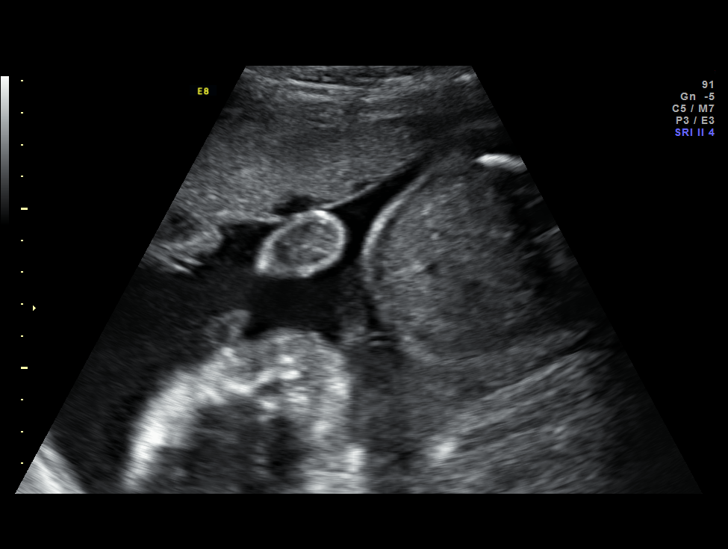
[im 10/84]
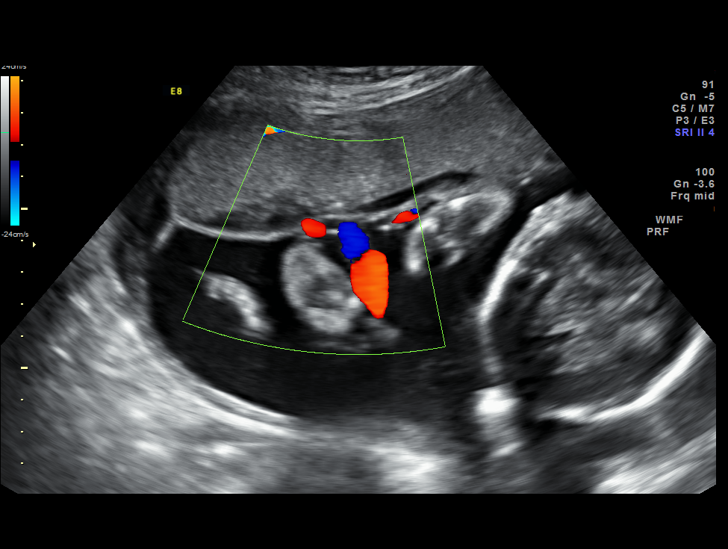
[im 16/84]
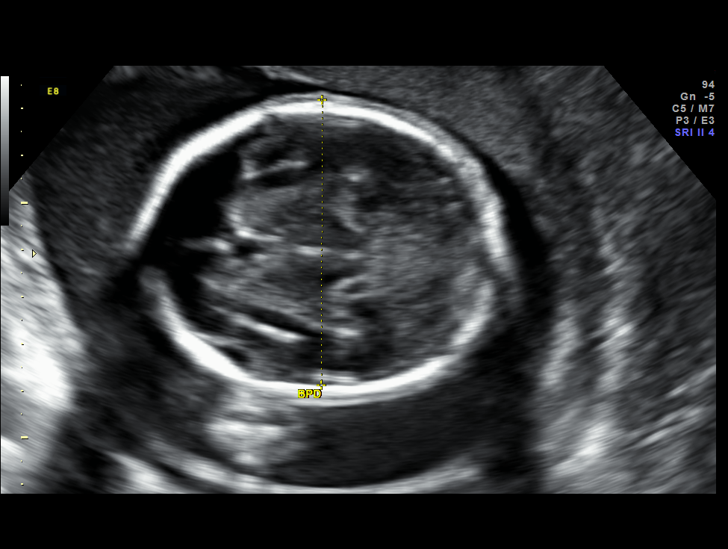
[im 22/84]
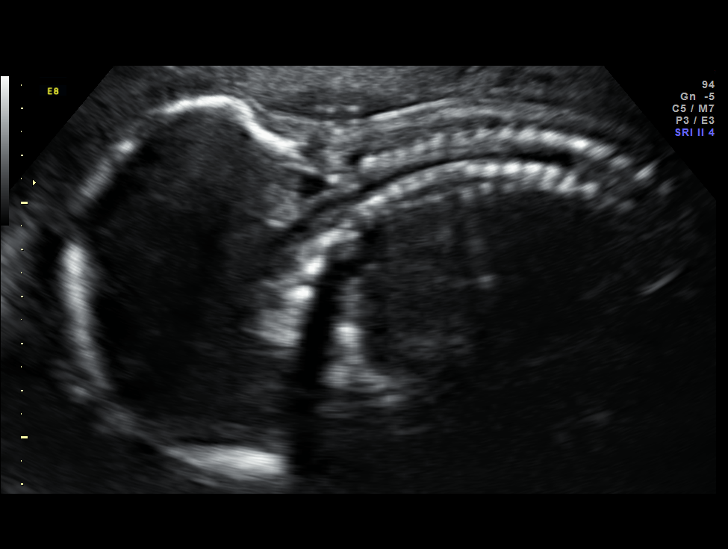
[im 28/84]
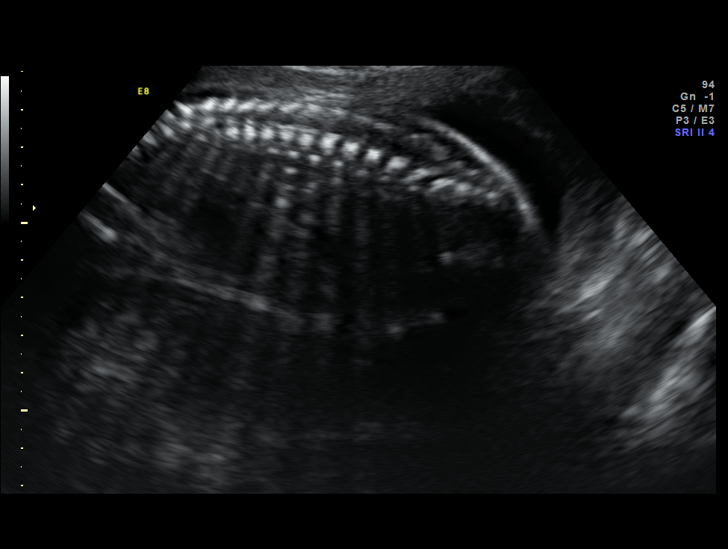
[im 34/84]
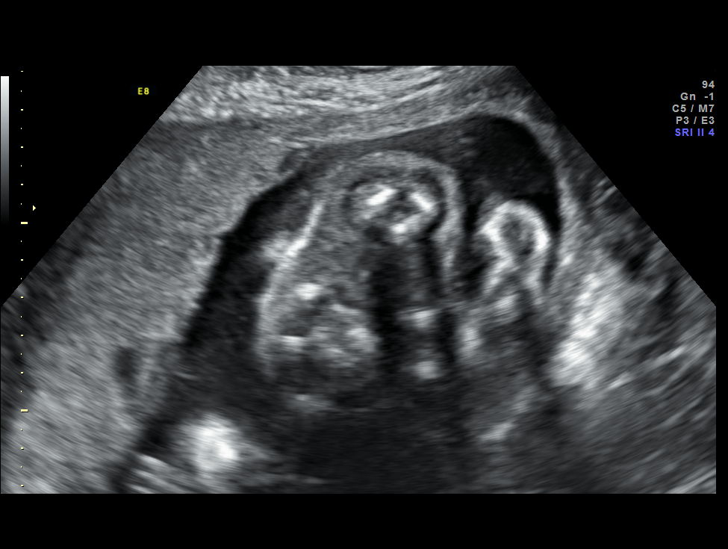
[im 40/84]
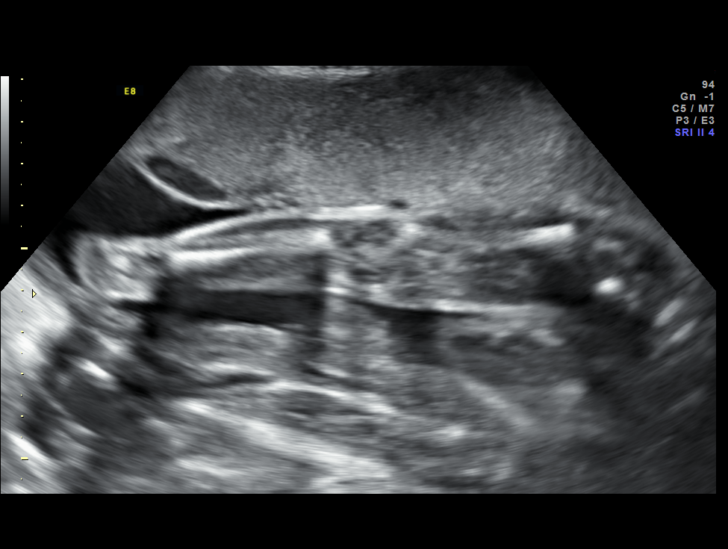
[im 47/84]
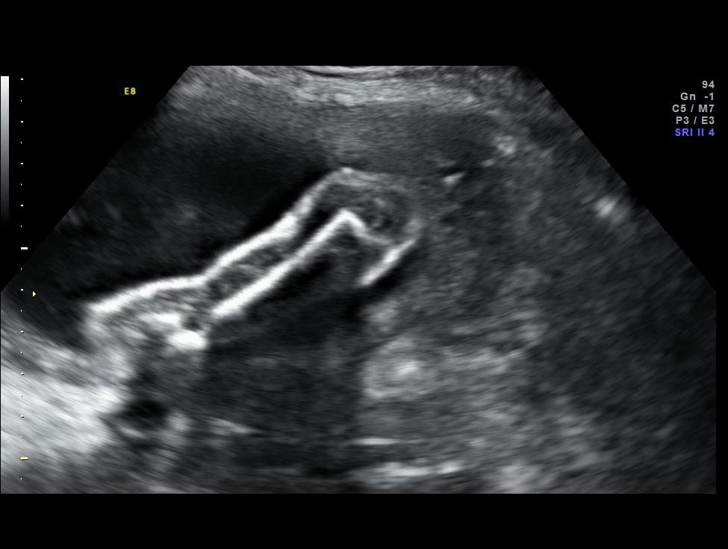
[im 53/84]
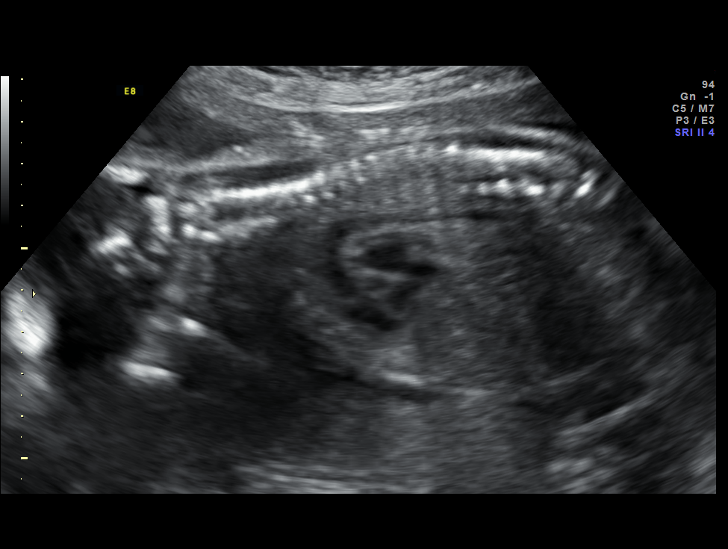
[im 59/84]
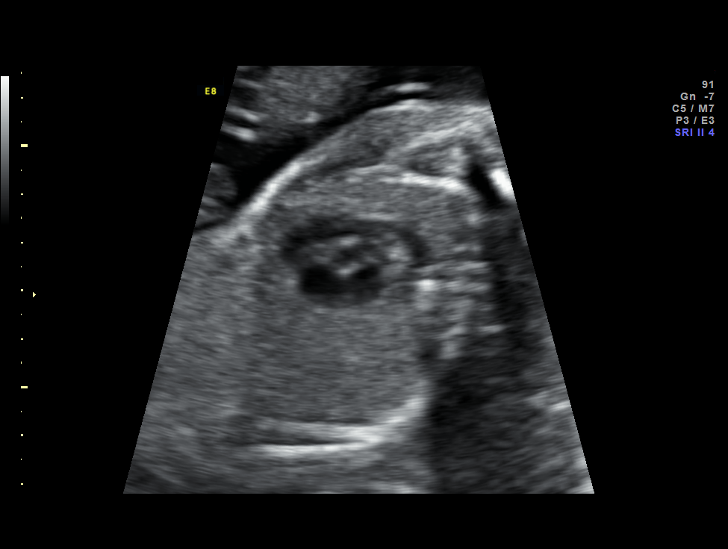
[im 65/84]
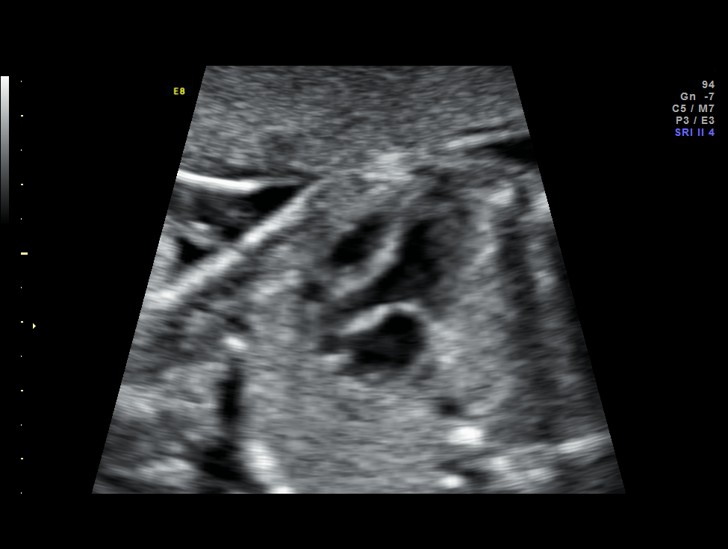
[im 71/84]
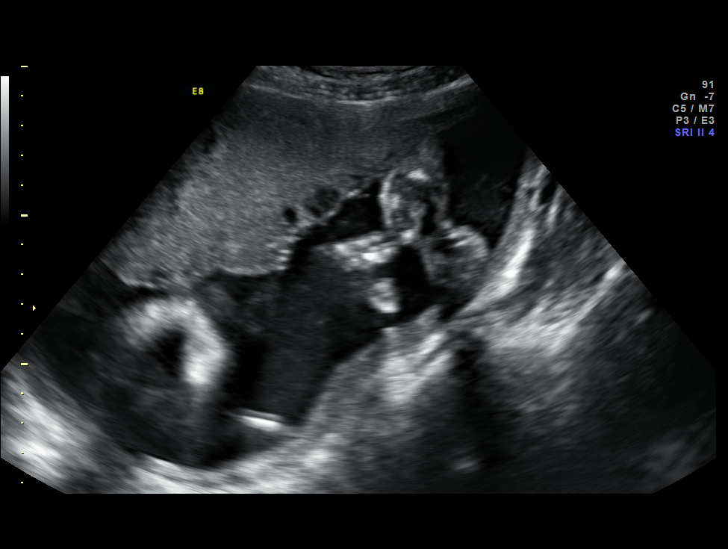
[im 77/84]
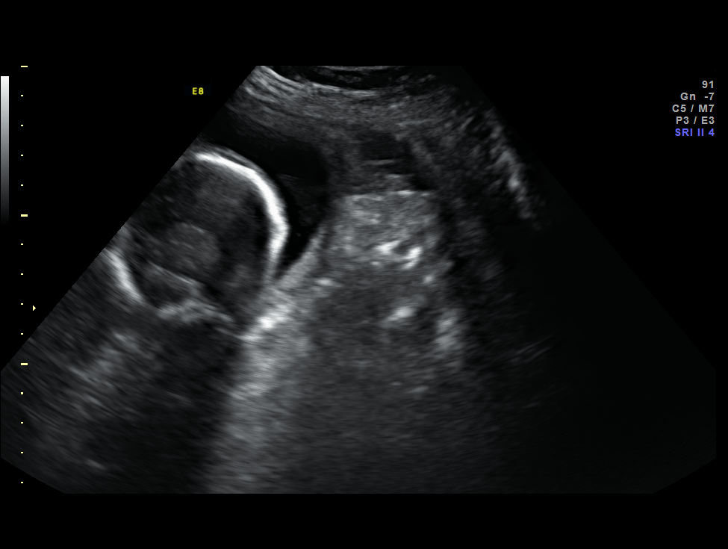
[im 84/84]
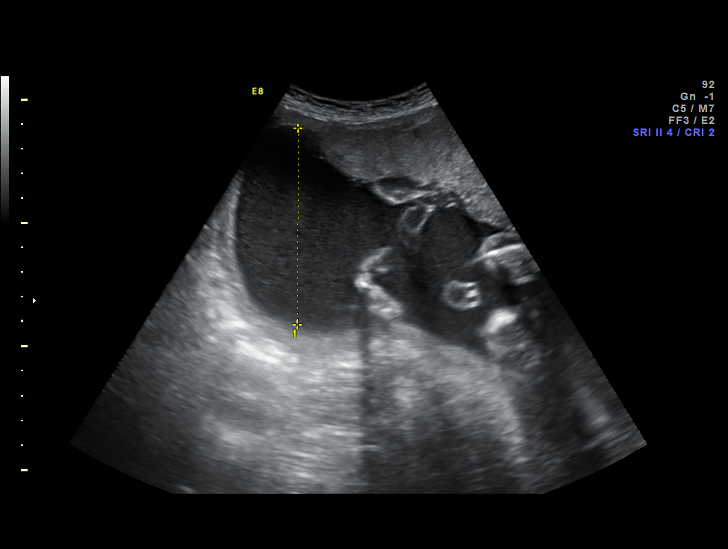

[14 of 28 positions shown; findings below may reference images not displayed]

Canned report from images found in remote index.

Refer to host system for actual result text.

## 2012-02-14 ENCOUNTER — Ambulatory Visit: Payer: Self-pay

## 2013-11-30 ENCOUNTER — Encounter (HOSPITAL_COMMUNITY): Payer: Self-pay | Admitting: *Deleted

## 2014-09-10 ENCOUNTER — Emergency Department (HOSPITAL_COMMUNITY)
Admission: EM | Admit: 2014-09-10 | Discharge: 2014-09-10 | Disposition: A | Discharge status: Home / Self Care | Payer: Managed Care, Other (non HMO) | Attending: Emergency Medicine | Admitting: Emergency Medicine

## 2014-09-10 ENCOUNTER — Encounter (HOSPITAL_COMMUNITY): Payer: Self-pay | Admitting: Emergency Medicine

## 2014-09-10 DIAGNOSIS — Z87891 Personal history of nicotine dependence: Secondary | ICD-10-CM | POA: Insufficient documentation

## 2014-09-10 DIAGNOSIS — Z87440 Personal history of urinary (tract) infections: Secondary | ICD-10-CM | POA: Insufficient documentation

## 2014-09-10 DIAGNOSIS — Z8619 Personal history of other infectious and parasitic diseases: Secondary | ICD-10-CM | POA: Insufficient documentation

## 2014-09-10 DIAGNOSIS — R21 Rash and other nonspecific skin eruption: Secondary | ICD-10-CM | POA: Diagnosis present

## 2014-09-10 DIAGNOSIS — L509 Urticaria, unspecified: Secondary | ICD-10-CM | POA: Insufficient documentation

## 2014-09-10 MED ORDER — PREDNISONE 20 MG PO TABS: ORAL_TABLET | ORAL | Status: DC

## 2014-09-10 MED ORDER — FAMOTIDINE 20 MG PO TABS: 20.0000 mg | ORAL_TABLET | Freq: Two times a day (BID) | ORAL | Status: DC

## 2014-09-10 MED ORDER — PREDNISONE 20 MG PO TABS
60.0000 mg | ORAL_TABLET | Freq: Once | ORAL | Status: AC
  Administered 2014-09-10: 60 mg via ORAL
  Filled 2014-09-10: qty 3

## 2014-09-10 MED ORDER — FAMOTIDINE 20 MG PO TABS
20.0000 mg | ORAL_TABLET | Freq: Once | ORAL | Status: AC
  Administered 2014-09-10: 20 mg via ORAL
  Filled 2014-09-10: qty 1

## 2014-09-10 MED ORDER — DIPHENHYDRAMINE HCL 25 MG PO CAPS
50.0000 mg | ORAL_CAPSULE | Freq: Once | ORAL | Status: AC
  Administered 2014-09-10: 50 mg via ORAL
  Filled 2014-09-10: qty 2

## 2014-09-10 NOTE — ED Provider Notes (Signed)
CSN: [id number]     Arrival date & time 09/10/14  4098 History  This chart was scribed for non-physician practitioner Wynetta Emery, PA-C working with Lavera Guise, MD by Littie Deeds, ED Scribe. This patient was seen in room TR05C/TR05C and the patient's care was started at 10:12 AM.       Chief Complaint  Patient presents with  . Rash   The history is provided by the patient. No language interpreter was used.    HPI Comments: Teresa Perry is a 29 y.o. female who presents to the Emergency Department complaining of gradual onset, pruritic rash to her neck that started yesterday. The rash has since spread to her torso, face, and arms. She has been taking Benadryl and hydrocortisone cream for the rash with temporary relief. Of note, patient started using a new soap 2 days ago and got a new dog 2 weeks ago. Patient denies fever, nausea, vomiting, wheezing, and SOB. She also denies history of DM.   Past Medical History  Diagnosis Date  . No pertinent past medical history   . NVD (normal vaginal delivery) 09/07/2010  . Chlamydia   . Urinary tract infection    Past Surgical History  Procedure Laterality Date  . Cesarean section    . Therapeutic abortion     No family history on file. Social History  Substance Use Topics  . Smoking status: Former Smoker -- 0.25 packs/day  . Smokeless tobacco: Never Used  . Alcohol Use: Yes     Comment: Occas.   OB History    Gravida Para Term Preterm AB TAB SAB Ectopic Multiple Living   Review of Systems A complete 10 system review of systems was obtained and all systems are negative except as noted in the HPI and PMH.     Allergies  Review of patient's allergies indicates no known allergies.  Home Medications   Prior to Admission medications   Not on File   BP 127/79 mmHg  Pulse 61  Temp(Src) 98.7 F (37.1 C) (Oral)  Resp 16  SpO2 100% Physical Exam  Constitutional: She is oriented to person, place, and time. She  appears well-developed and well-nourished. No distress.  HENT:  Head: Normocephalic and atraumatic.  Mouth/Throat: Oropharynx is clear and moist. No oropharyngeal exudate.  Eyes: Pupils are equal, round, and reactive to light.  Neck: Neck supple.  Cardiovascular: Normal rate.   Pulmonary/Chest: Effort normal.  Musculoskeletal: She exhibits no edema.  Neurological: She is alert and oriented to person, place, and time. No cranial nerve deficit.  Skin: Skin is warm and dry. Rash noted.  Scattered papules, mildly excoriated scattered to upper extremities torso and face. No warmth, TTP or discharge. Lesions are blanchable; sparing palms, soles and mucous membranes   Psychiatric: She has a normal mood and affect. Her behavior is normal.  Nursing note and vitals reviewed.   ED Course  Procedures  DIAGNOSTIC STUDIES: Oxygen Saturation is 100% on room air, normal by my interpretation.    COORDINATION OF CARE: 10:15 AM-Discussed treatment plan which includes steroids, Pepcid, and Benadryl with patient/guardian at bedside and patient/guardian agreed to plan. Referral will be made for allergy testing if needed.   Labs Review Labs Reviewed - No data to display  Imaging Review No results found. I, Wynetta Emery, personally reviewed and evaluated these images and lab results as part of my medical decision-making.   EKG Interpretation None  MDM   Final diagnoses:  None    Filed Vitals:   09/10/14 0930  BP: 127/79  Pulse: 61  Temp: 98.7 F (37.1 C)  TempSrc: Oral  Resp: 16  SpO2: 100%    Medications  predniSONE (DELTASONE) tablet 60 mg (not administered)  famotidine (PEPCID) tablet 20 mg (not administered)  diphenhydrAMINE (BENADRYL) capsule 50 mg (not administered)    Teresa Perry is a pleasant 29 y.o. female presenting with pruritic rash likely allergic reaction, no signs of secondary organ involvement. No indication for epinephrine administration. Patient has  been taking Benadryl and applying hydrocortisone ointment at home. Offered her prednisone and Pepcid which she accepts. Advised patient not to apply hydrocortisone ointment to the face.  Evaluation does not show pathology that would require ongoing emergent intervention or inpatient treatment. Pt is hemodynamically stable and mentating appropriately. Discussed findings and plan with patient/guardian, who agrees with care plan. All questions answered. Return precautions discussed and outpatient follow up given.   New Prescriptions   FAMOTIDINE (PEPCID) 20 MG TABLET    Take 1 tablet (20 mg total) by mouth 2 (two) times daily.   PREDNISONE (DELTASONE) 20 MG TABLET    3 tabs po day one, then 2 po daily x 4 days     I personally performed the services described in this documentation, which was scribed in my presence. The recorded information has been reviewed and is accurate.   Wynetta Emery, PA-C 09/10/14 1032  Lavera Guise, MD 09/10/14 2043

## 2014-09-10 NOTE — Discharge Instructions (Signed)
1 to 2 tablets of 25 mg Benadryl pills every 4-6 hours as needed to a maximum of 300 mg per day. In addition, you may apply a topical hydrocortisone ointment to all affected areas except for the face.  ° °Do not hesitate to call 911 or return to the emergency room if you develop any shortness of breath, wheezing, tongue or lip swelling. ° °Please follow with your primary care doctor in the next 2 days for a check-up. They must obtain records for further management.  ° °Do not hesitate to return to the Emergency Department for any new, worsening or concerning symptoms.  ° ° °

## 2014-09-10 NOTE — ED Notes (Signed)
Rash, started yesterday,itching rash to arms, face, neck. Starting on abd and back.

## 2016-10-25 ENCOUNTER — Encounter (HOSPITAL_COMMUNITY): Payer: Self-pay | Admitting: Emergency Medicine

## 2016-10-25 ENCOUNTER — Ambulatory Visit (HOSPITAL_COMMUNITY)
Admission: EM | Admit: 2016-10-25 | Discharge: 2016-10-25 | Disposition: A | Discharge status: Home / Self Care | Payer: Managed Care, Other (non HMO) | Attending: Family Medicine | Admitting: Family Medicine

## 2016-10-25 DIAGNOSIS — Z8619 Personal history of other infectious and parasitic diseases: Secondary | ICD-10-CM | POA: Insufficient documentation

## 2016-10-25 DIAGNOSIS — Z113 Encounter for screening for infections with a predominantly sexual mode of transmission: Secondary | ICD-10-CM

## 2016-10-25 DIAGNOSIS — Z202 Contact with and (suspected) exposure to infections with a predominantly sexual mode of transmission: Secondary | ICD-10-CM | POA: Diagnosis not present

## 2016-10-25 DIAGNOSIS — R109 Unspecified abdominal pain: Secondary | ICD-10-CM

## 2016-10-25 DIAGNOSIS — Z87891 Personal history of nicotine dependence: Secondary | ICD-10-CM | POA: Diagnosis not present

## 2016-10-25 DIAGNOSIS — Z3202 Encounter for pregnancy test, result negative: Secondary | ICD-10-CM

## 2016-10-25 DIAGNOSIS — Z87440 Personal history of urinary (tract) infections: Secondary | ICD-10-CM | POA: Diagnosis not present

## 2016-10-25 LAB — POCT URINALYSIS DIP (DEVICE)
Bilirubin Urine: NEGATIVE
Glucose, UA: NEGATIVE mg/dL
Ketones, ur: NEGATIVE mg/dL
Leukocytes, UA: NEGATIVE
Nitrite: NEGATIVE
Protein, ur: NEGATIVE mg/dL
Specific Gravity, Urine: 1.02 (ref 1.005–1.030)
Urobilinogen, UA: 1 mg/dL (ref 0.0–1.0)
pH: 7 (ref 5.0–8.0)

## 2016-10-25 LAB — POCT PREGNANCY, URINE: Preg Test, Ur: NEGATIVE

## 2016-10-25 MED ORDER — CEFTRIAXONE SODIUM 250 MG IJ SOLR
250.0000 mg | Freq: Once | INTRAMUSCULAR | Status: AC
  Administered 2016-10-25: 250 mg via INTRAMUSCULAR

## 2016-10-25 MED ORDER — CEFTRIAXONE SODIUM 250 MG IJ SOLR
INTRAMUSCULAR | Status: AC
  Filled 2016-10-25: qty 250

## 2016-10-25 MED ORDER — AZITHROMYCIN 250 MG PO TABS
1000.0000 mg | ORAL_TABLET | Freq: Once | ORAL | Status: AC
  Administered 2016-10-25: 1000 mg via ORAL

## 2016-10-25 MED ORDER — STERILE WATER FOR INJECTION IJ SOLN
INTRAMUSCULAR | Status: AC
  Filled 2016-10-25: qty 10

## 2016-10-25 MED ORDER — AZITHROMYCIN 250 MG PO TABS
ORAL_TABLET | ORAL | Status: AC
  Filled 2016-10-25: qty 4

## 2016-10-25 NOTE — ED Provider Notes (Signed)
[facility]   [id number] 10/25/16 Arrival Time: 1110  ASSESSMENT & PLAN:  Today you were diagnosed with the following: 1. STD exposure    You have been given the following medications today for treatment of suspected gonorrhea and/or chlamydia:  cefTRIAXone (ROCEPHIN) injection 250 mg azithromycin (ZITHROMAX) tablet 1,000 mg  Even though we have treated you today, we have sent testing for sexually transmitted infections. We will notify you of the results once they are received.  Reviewed expectations re: course of current medical issues. Questions answered. Outlined signs and symptoms indicating need for more acute intervention. Patient verbalized understanding. After Visit Summary given.  SUBJECTIVE:  Teresa Perry is a 31 y.o. female who reports that her female sexual partner was recently diagnosed with Chlamydia. She reports mild lower abdominal discomfort on and off for the past few weeks. Afebrile. No n/v. No rashes or lesions. On Depo and reports intermittent spotting. No heavy vaginal bleeding. No specific vaginal discharge. History of similar symptoms: No.  No LMP recorded. Patient has had an injection.  ROS: As per HPI.  OBJECTIVE:  Vitals:   10/25/16 1207  BP: 107/66  Pulse: 61  Resp: 18  Temp: 98.6 F (37 C)  TempSrc: Oral  SpO2: 100%    General appearance: alert, cooperative, appears stated age and no distress Throat: lips, mucosa, and tongue normal; teeth and gums normal Back: no CVA tenderness Abdomen: soft, non-tender; bowel sounds normal; no masses or organomegaly; no guarding or rebound tenderness GU: declines Skin: warm and dry Psychological:  Alert and cooperative. Normal mood and affect.  Results for orders placed or performed during the hospital encounter of 10/25/16  POCT urinalysis dip (device)  Result Value Ref Range   Glucose, UA NEGATIVE NEGATIVE mg/dL   Bilirubin Urine NEGATIVE NEGATIVE   Ketones, ur NEGATIVE NEGATIVE mg/dL   Specific Gravity, Urine 1.020 1.005 - 1.030   Hgb urine dipstick TRACE (A) NEGATIVE   pH 7.0 5.0 - 8.0   Protein, ur NEGATIVE NEGATIVE mg/dL   Urobilinogen, UA 1.0 0.0 - 1.0 mg/dL   Nitrite NEGATIVE NEGATIVE   Leukocytes, UA NEGATIVE NEGATIVE  Pregnancy, urine POC  Result Value Ref Range   Preg Test, Ur NEGATIVE NEGATIVE    Labs Reviewed  POCT URINALYSIS DIP (DEVICE) - Abnormal; Notable for the following:       Result Value   Hgb urine dipstick TRACE (*)    All other components within normal limits  POCT PREGNANCY, URINE  URINE CYTOLOGY ANCILLARY ONLY    No Known Allergies  Past Medical History:  Diagnosis Date  . Chlamydia   . No pertinent past medical history   . NVD (normal vaginal delivery) 09/07/2010  . Urinary tract infection     Social History   Social History  . Marital status: Single    Spouse name: N/A  . Number of children: N/A  . Years of education: N/A   Occupational History  . Not on file.   Social History Main Topics  . Smoking status: Former Smoker    Packs/day: 0.25  . Smokeless tobacco: Never Used  . Alcohol use Yes     Comment: Occas.  . Drug use: No  . Sexual activity: Yes    Birth control/ protection: Injection     Comment: States was not using BCP correctly. Planning to get Depo 9/ 13/13   Other Topics Concern  . Not on file   Social History Narrative  . No narrative on file  Mardella Layman, MD 10/25/16 [phone]

## 2016-10-25 NOTE — Discharge Instructions (Addendum)
You have been given the following medications today for treatment of suspected gonorrhea and/or chlamydia:  cefTRIAXone (ROCEPHIN) injection 250 mg azithromycin (ZITHROMAX) tablet 1,000 mg  Even though we have treated you today, we have sent testing for sexually transmitted infections. We will notify you of the results once they are received. If needed, we will prescribe any medications needed.

## 2016-10-25 NOTE — ED Notes (Signed)
Sent to bathroom for dirty and clean urine with instructions 

## 2016-10-25 NOTE — ED Triage Notes (Signed)
Partner told patient he has chlamydia.  Intermittent vaginal bleeding, abdominal pain.  Symptoms for 1-2 weeks

## 2016-10-26 LAB — URINE CYTOLOGY ANCILLARY ONLY
Chlamydia: NEGATIVE
Neisseria Gonorrhea: NEGATIVE
Trichomonas: NEGATIVE

## 2016-10-29 LAB — URINE CYTOLOGY ANCILLARY ONLY: Candida vaginitis: NEGATIVE

## 2017-05-12 ENCOUNTER — Other Ambulatory Visit: Payer: Self-pay

## 2017-05-12 ENCOUNTER — Emergency Department (HOSPITAL_COMMUNITY)
Admission: EM | Admit: 2017-05-12 | Discharge: 2017-05-12 | Disposition: A | Discharge status: Home / Self Care | Payer: BLUE CROSS/BLUE SHIELD | Attending: Emergency Medicine | Admitting: Emergency Medicine

## 2017-05-12 ENCOUNTER — Encounter (HOSPITAL_COMMUNITY): Payer: Self-pay | Admitting: Emergency Medicine

## 2017-05-12 DIAGNOSIS — K029 Dental caries, unspecified: Secondary | ICD-10-CM | POA: Diagnosis not present

## 2017-05-12 DIAGNOSIS — Z87891 Personal history of nicotine dependence: Secondary | ICD-10-CM | POA: Insufficient documentation

## 2017-05-12 DIAGNOSIS — K0889 Other specified disorders of teeth and supporting structures: Secondary | ICD-10-CM | POA: Diagnosis not present

## 2017-05-12 MED ORDER — NAPROXEN 500 MG PO TABS: 500.0000 mg | ORAL_TABLET | Freq: Two times a day (BID) | ORAL | 0 refills | Status: DC

## 2017-05-12 MED ORDER — PENICILLIN V POTASSIUM 500 MG PO TABS: 500.0000 mg | ORAL_TABLET | Freq: Four times a day (QID) | ORAL | 0 refills | Status: AC

## 2017-05-12 MED ORDER — KETOROLAC TROMETHAMINE 30 MG/ML IJ SOLN
15.0000 mg | Freq: Once | INTRAMUSCULAR | Status: AC
  Administered 2017-05-12: 15 mg via INTRAMUSCULAR
  Filled 2017-05-12: qty 1

## 2017-05-12 NOTE — ED Triage Notes (Signed)
Pt states she has had minor off and on pain to her left lower "wisdom tooth"  But tonight awoke with sudden sharp constant pain that does not go away. States she plans to go to the dentist on Monday.  No fever. Teeth intact.  Mild swelling noted.

## 2017-05-12 NOTE — Discharge Instructions (Addendum)
Please read instructions below. Take the antibiotic, Penicillin V, 4 times per day until they are gone. You can take Naproxen up to 2 times per day with meals, as needed for pain. Schedule an appointment with you dentist. Return to the ER for difficulty swallowing or breathing, fever, or new or worsening symptoms.

## 2017-05-12 NOTE — ED Provider Notes (Signed)
[facility]   CSN: [id number] Arrival date & time: 05/12/17  0440     History   Chief Complaint Chief Complaint  Patient presents with  . Dental Pain    HPI Teresa Perry is a 32 y.o. female presenting to the ED with left lower dental pain. Pt states pain began Friday, described as throbbing and sharp. Has taken advil with minimal relief of symptoms. Denies difficulty swallowing or breathing, fever, chills, N/V, or other symptoms today. States she has a Education officer, communitydentist and plans to go tomorrow.  The history is provided by the patient.    Past Medical History:  Diagnosis Date  . Chlamydia   . No pertinent past medical history   . NVD (normal vaginal delivery) 09/07/2010  . Urinary tract infection     There are no active problems to display for this patient.   Past Surgical History:  Procedure Laterality Date  . CESAREAN SECTION    . THERAPEUTIC ABORTION       OB History    Gravida  5   Para  4   Term  4   Preterm      AB  1   Living  4     SAB      TAB  1   Ectopic      Multiple      Live Births  1            Home Medications    Prior to Admission medications   Medication Sig Start Date End Date Taking? Authorizing Provider  naproxen (NAPROSYN) 500 MG tablet Take 1 tablet (500 mg total) by mouth 2 (two) times daily. 05/12/17   Taiwana Willison, [place] N, PA-C  penicillin v potassium (VEETID) 500 MG tablet Take 1 tablet (500 mg total) by mouth 4 (four) times daily for 7 days. 05/12/17 05/19/17  Micheal Murad, [place] N, PA-C    Family History No family history on file.  Social History Social History   Tobacco Use  . Smoking status: Former Smoker    Packs/day: 0.25  . Smokeless tobacco: Never Used  Substance Use Topics  . Alcohol use: Yes    Comment: Occas.  . Drug use: No     Allergies   Patient has no known allergies.   Review of Systems Review of Systems  Constitutional: Negative for chills  and fever.  HENT: Positive for dental problem. Negative for trouble swallowing and voice change.   Respiratory: Negative for stridor.      Physical Exam Updated Vital Signs BP 101/60 (BP Location: Right Arm)   Pulse (!) 52   Temp 98.5 F (36.9 C) (Oral)   Resp 17   SpO2 100%   Physical Exam  Constitutional: She appears well-developed and well-nourished. No distress.  Tolerating secretions  HENT:  Head: Normocephalic and atraumatic.  Mouth/Throat: Uvula is midline. No trismus in the jaw. No uvula swelling. No posterior oropharyngeal edema.    Poor dentition throughout.  Left lower molars with decay.  Mild gingival erythema surrounding, no fluctuant abscess.  No sublingual edema or tenderness.  Eyes: Conjunctivae are normal.  Neck: Normal range of motion. Neck supple.  Pulmonary/Chest: Effort normal and breath sounds normal. No stridor.  Lymphadenopathy:    She has no cervical adenopathy.  Psychiatric: She has a normal mood and affect. Her behavior is normal.  Nursing note and vitals reviewed.    ED Treatments / Results  Labs (all labs ordered are listed,  but only abnormal results are displayed) Labs Reviewed - No data to display  EKG None  Radiology No results found.  Procedures Procedures (including critical care time)  Medications Ordered in ED Medications  ketorolac (TORADOL) 30 MG/ML injection 15 mg (has no administration in time range)     Initial Impression / Assessment and Plan / ED Course  I have reviewed the triage vital signs and the nursing notes.  Pertinent labs & imaging results that were available during my care of the patient were reviewed by me and considered in my medical decision making (see chart for details).     Patient with dental caries.  No gross abscess.  VSS, afebrile, tolerating secretions. Exam unconcerning for peritonsillar abscess, Ludwig's angina or spread of infection.  Will treat with penicillin and pain medicine.  Urged  patient to follow-up with her dentist. Pt safe for discharge.  Discussed results, findings, treatment and follow up. Patient advised of return precautions. Patient verbalized understanding and agreed with plan.   Final Clinical Impressions(s) / ED Diagnoses   Final diagnoses:  Pain due to dental caries    ED Discharge Orders        Ordered    naproxen (NAPROSYN) 500 MG tablet  2 times daily     05/12/17 0756    penicillin v potassium (VEETID) 500 MG tablet  4 times daily     05/12/17 0756       Darneshia Demary, [place] N, PA-C 05/12/17 1610    Gerhard Munch, MD 05/12/17 1610

## 2017-05-31 DIAGNOSIS — Z3202 Encounter for pregnancy test, result negative: Secondary | ICD-10-CM | POA: Diagnosis not present

## 2017-05-31 DIAGNOSIS — Z3042 Encounter for surveillance of injectable contraceptive: Secondary | ICD-10-CM | POA: Diagnosis not present

## 2017-09-27 DIAGNOSIS — Z3042 Encounter for surveillance of injectable contraceptive: Secondary | ICD-10-CM | POA: Diagnosis not present

## 2017-09-27 DIAGNOSIS — Z3202 Encounter for pregnancy test, result negative: Secondary | ICD-10-CM | POA: Diagnosis not present

## 2018-01-28 ENCOUNTER — Emergency Department (HOSPITAL_BASED_OUTPATIENT_CLINIC_OR_DEPARTMENT_OTHER)
Admission: EM | Admit: 2018-01-28 | Discharge: 2018-01-28 | Disposition: A | Discharge status: Home / Self Care | Payer: BLUE CROSS/BLUE SHIELD | Attending: Emergency Medicine | Admitting: Emergency Medicine

## 2018-01-28 ENCOUNTER — Other Ambulatory Visit: Payer: Self-pay

## 2018-01-28 ENCOUNTER — Encounter (HOSPITAL_BASED_OUTPATIENT_CLINIC_OR_DEPARTMENT_OTHER): Payer: Self-pay | Admitting: Emergency Medicine

## 2018-01-28 DIAGNOSIS — L03211 Cellulitis of face: Secondary | ICD-10-CM | POA: Insufficient documentation

## 2018-01-28 DIAGNOSIS — J3489 Other specified disorders of nose and nasal sinuses: Secondary | ICD-10-CM | POA: Diagnosis not present

## 2018-01-28 DIAGNOSIS — Z87891 Personal history of nicotine dependence: Secondary | ICD-10-CM | POA: Insufficient documentation

## 2018-01-28 MED ORDER — DOXYCYCLINE HYCLATE 100 MG PO CAPS: 100.0000 mg | ORAL_CAPSULE | Freq: Two times a day (BID) | ORAL | 0 refills | Status: DC

## 2018-01-28 NOTE — ED Triage Notes (Signed)
Dental pain front upper tooth x4 days.  Pt sts she believes there is an abscess.

## 2018-01-28 NOTE — ED Provider Notes (Signed)
[facility] EMERGENCY DEPARTMENT Provider Note   CSN: [id number] Arrival date & time: 01/28/18  1554     History   Chief Complaint Chief Complaint  Patient presents with  . Dental Pain    HPI Teresa Perry is a 32 y.o. female.  Patient is a healthy 32 year old female presenting today with 4 to 5 days of worsening pain in the entrance of her left nare.  She has noticed some mild swelling as well.  She also states the pain radiates into her teeth and is about a 7 out of 10.  She has been taking ibuprofen without relief.  She recently did have a cold and was blowing her nose a lot but that has improved.  She denies any other symptoms such as fever, difficulty eating, congestion, ear pain or eye issues.  The history is provided by the patient.    Past Medical History:  Diagnosis Date  . Chlamydia   . No pertinent past medical history   . NVD (normal vaginal delivery) 09/07/2010  . Urinary tract infection     There are no active problems to display for this patient.   Past Surgical History:  Procedure Laterality Date  . CESAREAN SECTION    . THERAPEUTIC ABORTION       OB History    Gravida  5   Para  4   Term  4   Preterm      AB  1   Living  4     SAB      TAB  1   Ectopic      Multiple      Live Births  1            Home Medications    Prior to Admission medications   Medication Sig Start Date End Date Taking? Authorizing Provider  doxycycline (VIBRAMYCIN) 100 MG capsule Take 1 capsule (100 mg total) by mouth 2 (two) times daily. 01/28/18   Gwyneth SproutPlunkett, Marily Konczal, MD  naproxen (NAPROSYN) 500 MG tablet Take 1 tablet (500 mg total) by mouth 2 (two) times daily. 05/12/17   Robinson, [place] N, PA-C    Family History No family history on file.  Social History Social History   Tobacco Use  . Smoking status: Former Smoker    Packs/day: 0.25  . Smokeless tobacco: Never Used  Substance Use Topics  . Alcohol use: Yes    Comment: Occas.    . Drug use: No     Allergies   Patient has no known allergies.   Review of Systems Review of Systems  All other systems reviewed and are negative.    Physical Exam Updated Vital Signs BP 114/61 (BP Location: Right Arm)   Pulse 60   Temp 98.7 F (37.1 C) (Oral)   Resp 12   Ht 5\' 6"  (1.676 m)   Wt 81.6 kg   SpO2 100%   BMI 29.05 kg/m   Physical Exam Vitals signs and nursing note reviewed.  Constitutional:      General: She is not in acute distress. HENT:     Head: Normocephalic.     Nose:      Mouth/Throat:     Mouth: Mucous membranes are moist.     Dentition: No dental caries or gum lesions.  Eyes:     Pupils: Pupils are equal, round, and reactive to light.  Cardiovascular:     Rate and Rhythm: Normal rate.     Pulses: Normal pulses.  Pulmonary:  Effort: Pulmonary effort is normal.  Skin:    Capillary Refill: Capillary refill takes less than 2 seconds.  Neurological:     Mental Status: She is alert. Mental status is at baseline.  Psychiatric:        Mood and Affect: Mood normal.      ED Treatments / Results  Labs (all labs ordered are listed, but only abnormal results are displayed) Labs Reviewed - No data to display  EKG None  Radiology No results found.  Procedures Procedures (including critical care time)  Medications Ordered in ED Medications - No data to display   Initial Impression / Assessment and Plan / ED Course  I have reviewed the triage vital signs and the nursing notes.  Pertinent labs & imaging results that were available during my care of the patient were reviewed by me and considered in my medical decision making (see chart for details).    Patient with evidence of some mild facial cellulitis and possible early abscess at the entrance of the left nare.  It does not appear to be a dental infection.  Patient was started on antibiotics and given follow-up if symptoms worsen.  Final Clinical Impressions(s) / ED Diagnoses    Final diagnoses:  Facial cellulitis    ED Discharge Orders         Ordered    doxycycline (VIBRAMYCIN) 100 MG capsule  2 times daily     01/28/18 1734           Gwyneth SproutPlunkett, Jadeyn Hargett, MD 01/28/18 1738

## 2018-01-28 NOTE — Discharge Instructions (Signed)
It appears that you have the early signs of an infection starting at the corner of side of your nose.  Use warm compresses and take the antibiotic.  It should improve within the next 3 days and may even drain.  You can take Aleve twice a day and take Tylenol 2 tablets of extra strength with the Aleve and then 2 other times during the day.

## 2018-02-28 DIAGNOSIS — Z3042 Encounter for surveillance of injectable contraceptive: Secondary | ICD-10-CM | POA: Diagnosis not present

## 2018-02-28 DIAGNOSIS — Z3202 Encounter for pregnancy test, result negative: Secondary | ICD-10-CM | POA: Diagnosis not present

## 2018-03-09 ENCOUNTER — Ambulatory Visit (HOSPITAL_COMMUNITY)
Admission: EM | Admit: 2018-03-09 | Discharge: 2018-03-09 | Disposition: A | Discharge status: Home / Self Care | Payer: BLUE CROSS/BLUE SHIELD | Attending: Emergency Medicine | Admitting: Emergency Medicine

## 2018-03-09 ENCOUNTER — Encounter (HOSPITAL_COMMUNITY): Payer: Self-pay

## 2018-03-09 DIAGNOSIS — K047 Periapical abscess without sinus: Secondary | ICD-10-CM

## 2018-03-09 MED ORDER — AMOXICILLIN-POT CLAVULANATE 875-125 MG PO TABS: 1.0000 | ORAL_TABLET | Freq: Two times a day (BID) | ORAL | 0 refills | Status: DC

## 2018-03-09 MED ORDER — HYDROCODONE-ACETAMINOPHEN 5-325 MG PO TABS: 1.0000 | ORAL_TABLET | Freq: Four times a day (QID) | ORAL | 0 refills | Status: DC | PRN

## 2018-03-09 NOTE — ED Triage Notes (Signed)
Pt presents with dental pain on left side not associated with any particular event.

## 2018-03-09 NOTE — Discharge Instructions (Signed)

## 2018-03-10 NOTE — ED Provider Notes (Signed)
[facility]    CSN: [id number] Arrival date & time: 03/09/18  1720     History   Chief Complaint Chief Complaint  Patient presents with  . Dental Pain    HPI Teresa Perry is a 33 y.o. female no contributing past medical history presenting today for evaluation of dental pain.  Patient states that over the past 3 to 4 days she has had worsening dental pain on her left lower jaw.  She states that she is started to develop some swelling and is concerned about infection.  Notes that she has a broken tooth in this area.  She has been taking ibuprofen without relief.  She notes that she does have dental insurance, but does not have dentistry set up at this current time.  Denies fevers.  Denies difficulty moving neck.  Denies difficulty swallowing.  HPI  Past Medical History:  Diagnosis Date  . Chlamydia   . No pertinent past medical history   . NVD (normal vaginal delivery) 09/07/2010  . Urinary tract infection     There are no active problems to display for this patient.   Past Surgical History:  Procedure Laterality Date  . CESAREAN SECTION    . THERAPEUTIC ABORTION      OB History    Gravida  5   Para  4   Term  4   Preterm      AB  1   Living  4     SAB      TAB  1   Ectopic      Multiple      Live Births  1            Home Medications    Prior to Admission medications   Medication Sig Start Date End Date Taking? Authorizing Provider  amoxicillin-clavulanate (AUGMENTIN) 875-125 MG tablet Take 1 tablet by mouth every 12 (twelve) hours. 03/09/18   Crystin Lechtenberg C, PA-C  HYDROcodone-acetaminophen (NORCO/VICODIN) 5-325 MG tablet Take 1 tablet by mouth every 6 (six) hours as needed for severe pain. 03/09/18   Tyreona Panjwani C, PA-C  naproxen (NAPROSYN) 500 MG tablet Take 1 tablet (500 mg total) by mouth 2 (two) times daily. 05/12/17   Robinson, [place] N, PA-C    Family History History reviewed. No pertinent family history.  Social  History Social History   Tobacco Use  . Smoking status: Former Smoker    Packs/day: 0.25  . Smokeless tobacco: Never Used  Substance Use Topics  . Alcohol use: Yes    Comment: Occas.  . Drug use: No     Allergies   Patient has no known allergies.   Review of Systems Review of Systems  Constitutional: Negative for activity change, appetite change, chills, fatigue and fever.  HENT: Positive for dental problem. Negative for congestion, ear pain, rhinorrhea, sinus pressure, sore throat and trouble swallowing.   Eyes: Negative for discharge and redness.  Respiratory: Negative for cough, chest tightness and shortness of breath.   Cardiovascular: Negative for chest pain.  Gastrointestinal: Negative for abdominal pain, diarrhea, nausea and vomiting.  Musculoskeletal: Negative for myalgias.  Skin: Negative for rash.  Neurological: Negative for dizziness, light-headedness and headaches.     Physical Exam Triage Vital Signs ED Triage Vitals  Enc Vitals Group     BP 03/09/18 1800 123/69     Pulse Rate 03/09/18 1800 69     Resp 03/09/18 1800 18     Temp 03/09/18 1800 99 F (37.2  C)     Temp Source 03/09/18 1800 Oral     SpO2 03/09/18 1800 100 %     Weight --      Height --      Head Circumference --      Peak Flow --      Pain Score 03/09/18 1801 10     Pain Loc --      Pain Edu? --      Excl. in GC? --    No data found.  Updated Vital Signs BP 123/69 (BP Location: Right Arm)   Pulse 69   Temp 99 F (37.2 C) (Oral)   Resp 18   SpO2 100%   Visual Acuity Right Eye Distance:   Left Eye Distance:   Bilateral Distance:    Right Eye Near:   Left Eye Near:    Bilateral Near:     Physical Exam Vitals signs and nursing note reviewed.  Constitutional:      General: She is not in acute distress.    Appearance: She is well-developed.  HENT:     Head: Normocephalic and atraumatic.     Mouth/Throat:     Comments: Poor dentition, posterior molar on left lower jaw  fractured, other teeth missing, surrounding gingiva tender, slightly increase in erythema and mild swelling  Posterior pharynx patent, no soft palate swelling, no uvula deviation or swelling, nontender to soft palate below the tongue Eyes:     Conjunctiva/sclera: Conjunctivae normal.  Neck:     Musculoskeletal: Neck supple.     Comments: Full active range of motion of neck, no overlying neck erythema or swelling Cardiovascular:     Rate and Rhythm: Normal rate and regular rhythm.     Heart sounds: No murmur.  Pulmonary:     Effort: Pulmonary effort is normal. No respiratory distress.     Breath sounds: Normal breath sounds.  Abdominal:     Palpations: Abdomen is soft.     Tenderness: There is no abdominal tenderness.  Skin:    General: Skin is warm and dry.  Neurological:     Mental Status: She is alert.      UC Treatments / Results  Labs (all labs ordered are listed, but only abnormal results are displayed) Labs Reviewed - No data to display  EKG None  Radiology No results found.  Procedures Procedures (including critical care time)  Medications Ordered in UC Medications - No data to display  Initial Impression / Assessment and Plan / UC Course  I have reviewed the triage vital signs and the nursing notes.  Pertinent labs & imaging results that were available during my care of the patient were reviewed by me and considered in my medical decision making (see chart for details).     Patient with possible underlying dental infection, vital signs stable, will treat for infection with Augmentin, Tylenol and ibuprofen for mild to moderate pain, did provide a few tablets of hydrocodone for more severe pain/nighttime pain.  Discussed drowsiness regarding this.  Follow-up with dentistry for more permanent relief.Discussed strict return precautions. Patient verbalized understanding and is agreeable with plan.  Final Clinical Impressions(s) / UC Diagnoses   Final diagnoses:   Dental abscess     Discharge Instructions     Please use dental resource to contact offices to seek permenant treatment/relief.   Today we have given you an antibiotic. This should help with pain as any infection is cleared.   For pain please take 600mg -800mg  of  Ibuprofen every 8 hours, take with 1000 mg of Tylenol Extra strength every 8 hours. These are safe to take together. Please take with food.   I have also provided 2 days worth of stronger pain medication. This should only be used for severe pain. Do not drive or operate machinery while taking this medication.   Please return if you start to experience significant swelling of your face, experiencing fever.   ED Prescriptions    Medication Sig Dispense Auth. Provider   amoxicillin-clavulanate (AUGMENTIN) 875-125 MG tablet Take 1 tablet by mouth every 12 (twelve) hours. 14 tablet Jodine Muchmore C, PA-C   HYDROcodone-acetaminophen (NORCO/VICODIN) 5-325 MG tablet Take 1 tablet by mouth every 6 (six) hours as needed for severe pain. 8 tablet Cebastian Neis, [place] Grove[place]ie C, PA-C     Controlled Substance Prescriptions [place] Controlled Substance Registry consulted? Yes, I have consulted the [place] Controlled Substances Registry for this patient, and feel the risk/benefit ratio today is favorable for proceeding with this prescription for a controlled substance.   Lew DawesWieters, Adelynne Joerger C, [place] JerseyPA-C 03/10/18 [phone]

## 2018-06-06 DIAGNOSIS — Z3042 Encounter for surveillance of injectable contraceptive: Secondary | ICD-10-CM | POA: Diagnosis not present

## 2018-06-06 DIAGNOSIS — Z3202 Encounter for pregnancy test, result negative: Secondary | ICD-10-CM | POA: Diagnosis not present

## 2018-11-13 DIAGNOSIS — F4323 Adjustment disorder with mixed anxiety and depressed mood: Secondary | ICD-10-CM | POA: Diagnosis not present

## 2018-11-18 DIAGNOSIS — F4323 Adjustment disorder with mixed anxiety and depressed mood: Secondary | ICD-10-CM | POA: Diagnosis not present

## 2018-11-26 DIAGNOSIS — F4323 Adjustment disorder with mixed anxiety and depressed mood: Secondary | ICD-10-CM | POA: Diagnosis not present

## 2018-12-02 DIAGNOSIS — F4323 Adjustment disorder with mixed anxiety and depressed mood: Secondary | ICD-10-CM | POA: Diagnosis not present

## 2018-12-03 DIAGNOSIS — F321 Major depressive disorder, single episode, moderate: Secondary | ICD-10-CM | POA: Diagnosis not present

## 2018-12-03 DIAGNOSIS — E663 Overweight: Secondary | ICD-10-CM | POA: Insufficient documentation

## 2018-12-03 DIAGNOSIS — Z1331 Encounter for screening for depression: Secondary | ICD-10-CM | POA: Diagnosis not present

## 2018-12-03 DIAGNOSIS — F411 Generalized anxiety disorder: Secondary | ICD-10-CM | POA: Insufficient documentation

## 2018-12-03 DIAGNOSIS — A6000 Herpesviral infection of urogenital system, unspecified: Secondary | ICD-10-CM | POA: Insufficient documentation

## 2018-12-14 ENCOUNTER — Emergency Department (HOSPITAL_COMMUNITY)
Admission: EM | Admit: 2018-12-14 | Discharge: 2018-12-14 | Disposition: A | Discharge status: Home / Self Care | Payer: BC Managed Care – PPO | Attending: Emergency Medicine | Admitting: Emergency Medicine

## 2018-12-14 ENCOUNTER — Other Ambulatory Visit: Payer: Self-pay

## 2018-12-14 ENCOUNTER — Encounter (HOSPITAL_COMMUNITY): Payer: Self-pay | Admitting: Emergency Medicine

## 2018-12-14 DIAGNOSIS — Z113 Encounter for screening for infections with a predominantly sexual mode of transmission: Secondary | ICD-10-CM | POA: Diagnosis not present

## 2018-12-14 DIAGNOSIS — Z79899 Other long term (current) drug therapy: Secondary | ICD-10-CM | POA: Insufficient documentation

## 2018-12-14 DIAGNOSIS — R112 Nausea with vomiting, unspecified: Secondary | ICD-10-CM | POA: Diagnosis not present

## 2018-12-14 DIAGNOSIS — R1084 Generalized abdominal pain: Secondary | ICD-10-CM

## 2018-12-14 DIAGNOSIS — Z87891 Personal history of nicotine dependence: Secondary | ICD-10-CM | POA: Insufficient documentation

## 2018-12-14 LAB — COMPREHENSIVE METABOLIC PANEL
ALT: 41 U/L (ref 0–44)
AST: 29 U/L (ref 15–41)
Albumin: 4.7 g/dL (ref 3.5–5.0)
Alkaline Phosphatase: 50 U/L (ref 38–126)
Anion gap: 13 (ref 5–15)
BUN: 5 mg/dL — ABNORMAL LOW (ref 6–20)
CO2: 21 mmol/L — ABNORMAL LOW (ref 22–32)
Calcium: 9.7 mg/dL (ref 8.9–10.3)
Chloride: 104 mmol/L (ref 98–111)
Creatinine, Ser: 0.69 mg/dL (ref 0.44–1.00)
GFR calc Af Amer: >60 mL/min (ref >60)
GFR calc non Af Amer: >60 mL/min (ref >60)
Glucose, Bld: 131 mg/dL — ABNORMAL HIGH (ref 70–99)
Potassium: 3.7 mmol/L (ref 3.5–5.1)
Sodium: 138 mmol/L (ref 135–145)
Total Bilirubin: 0.6 mg/dL (ref 0.3–1.2)
Total Protein: 8.2 g/dL — ABNORMAL HIGH (ref 6.5–8.1)

## 2018-12-14 LAB — CBC
HCT: 41 % (ref 36.0–46.0)
Hemoglobin: 13.3 g/dL (ref 12.0–15.0)
MCH: 27 pg (ref 26.0–34.0)
MCHC: 32.4 g/dL (ref 30.0–36.0)
MCV: 83.3 fL (ref 80.0–100.0)
Platelets: 328 10*3/uL (ref 150–400)
RBC: 4.92 MIL/uL (ref 3.87–5.11)
RDW: 13.2 % (ref 11.5–15.5)
WBC: 8.5 10*3/uL (ref 4.0–10.5)
nRBC: 0 % (ref 0.0–0.2)

## 2018-12-14 LAB — I-STAT BETA HCG BLOOD, ED (MC, WL, AP ONLY): I-stat hCG, quantitative: <5 m[IU]/mL (ref <5)

## 2018-12-14 LAB — URINALYSIS, ROUTINE W REFLEX MICROSCOPIC
Bilirubin Urine: NEGATIVE
Glucose, UA: NEGATIVE mg/dL
Hgb urine dipstick: NEGATIVE
Ketones, ur: 20 mg/dL — AB
Leukocytes,Ua: NEGATIVE
Nitrite: NEGATIVE
Protein, ur: 100 mg/dL — AB
Specific Gravity, Urine: 1.018 (ref 1.005–1.030)
pH: 9 — ABNORMAL HIGH (ref 5.0–8.0)

## 2018-12-14 LAB — WET PREP, GENITAL
Sperm: NONE SEEN
Trich, Wet Prep: NONE SEEN
Yeast Wet Prep HPF POC: NONE SEEN

## 2018-12-14 LAB — LIPASE, BLOOD: Lipase: 21 U/L (ref 11–51)

## 2018-12-14 MED ORDER — SODIUM CHLORIDE 0.9 % IV BOLUS (SEPSIS)
1000.0000 mL | Freq: Once | INTRAVENOUS | Status: AC
  Administered 2018-12-14: 1000 mL via INTRAVENOUS

## 2018-12-14 MED ORDER — ONDANSETRON HCL 4 MG PO TABS: 4.0000 mg | ORAL_TABLET | Freq: Four times a day (QID) | ORAL | 0 refills | Status: DC

## 2018-12-14 MED ORDER — SODIUM CHLORIDE 0.9% FLUSH: 3.0000 mL | Freq: Once | INTRAVENOUS | Status: DC

## 2018-12-14 MED ORDER — ONDANSETRON HCL 4 MG/2ML IJ SOLN
4.0000 mg | Freq: Once | INTRAMUSCULAR | Status: AC
  Administered 2018-12-14: 4 mg via INTRAVENOUS
  Filled 2018-12-14: qty 2

## 2018-12-14 MED ORDER — KETOROLAC TROMETHAMINE 15 MG/ML IJ SOLN
15.0000 mg | Freq: Once | INTRAMUSCULAR | Status: AC
  Administered 2018-12-14: 15 mg via INTRAVENOUS
  Filled 2018-12-14: qty 1

## 2018-12-14 NOTE — ED Notes (Signed)
Pt d/c home per MD order. Discharge summary reviewed with pt , pt verbalizes understanding. No s/s of acute distress noted. Discharged home with significant other.

## 2018-12-14 NOTE — Discharge Instructions (Addendum)
You are seen today for abdominal pain.  Your labs are reassuring.  Your pregnancy test was negative.  Your pelvic exam was also normal. Your urine didn't show signs of infection.  We do not have the results yet of your gonorrhea and Chlamydia testing.  This should be back in a couple of days and if it is positive we will give you a call.  Please practice safe sex until then. I have sent some medicine to the pharmacy to help with your symptoms. Thank you for allowing me to care for you today. Please return to the emergency department if you have new or worsening symptoms. Take your medications as instructed.

## 2018-12-14 NOTE — ED Provider Notes (Signed)
[facility] EMERGENCY DEPARTMENT Provider Note   CSN: [id number] Arrival date & time: 12/14/18  1205     History   Chief Complaint Chief Complaint  Patient presents with  . Abdominal Pain  . Emesis    HPI Teresa Perry is a 33 y.o. female.     Patient is a 33 year old female past medical history of chlamydia, presenting to the emergency department for abdominal pain, nausea, vomiting.  Patient reports that she drank a lot of alcohol last night and this morning she woke up with intractable vomiting and nausea and lower abdominal pain.  She reports that she missed her last Depo shot in September and thinks that she might be pregnant.  She denies any diarrhea, dysuria, vaginal bleeding, vaginal discharge, fever, chills.  She has not tried anything for relief.     Past Medical History:  Diagnosis Date  . Chlamydia   . No pertinent past medical history   . NVD (normal vaginal delivery) 09/07/2010  . Urinary tract infection     There are no active problems to display for this patient.   Past Surgical History:  Procedure Laterality Date  . CESAREAN SECTION    . THERAPEUTIC ABORTION       OB History    Gravida  5   Para  4   Term  4   Preterm      AB  1   Living  4     SAB      TAB  1   Ectopic      Multiple      Live Births  1            Home Medications    Prior to Admission medications   Medication Sig Start Date End Date Taking? Authorizing Provider  amoxicillin-clavulanate (AUGMENTIN) 875-125 MG tablet Take 1 tablet by mouth every 12 (twelve) hours. 03/09/18   Wieters, Hallie C, PA-C  HYDROcodone-acetaminophen (NORCO/VICODIN) 5-325 MG tablet Take 1 tablet by mouth every 6 (six) hours as needed for severe pain. 03/09/18   Wieters, Hallie C, PA-C  naproxen (NAPROSYN) 500 MG tablet Take 1 tablet (500 mg total) by mouth 2 (two) times daily. 05/12/17   Robinson, [place] N, PA-C    Family History No family history on file.  Social  History Social History   Tobacco Use  . Smoking status: Former Smoker    Packs/day: 0.25  . Smokeless tobacco: Never Used  Substance Use Topics  . Alcohol use: Yes    Comment: Occas.  . Drug use: No     Allergies   Patient has no known allergies.   Review of Systems Review of Systems  Constitutional: Positive for appetite change. Negative for chills, diaphoresis, fatigue and fever.  HENT: Negative for congestion and sinus pain.   Respiratory: Negative for cough and shortness of breath.   Cardiovascular: Negative for chest pain.  Gastrointestinal: Positive for abdominal pain, nausea and vomiting. Negative for anal bleeding, blood in stool, constipation and diarrhea.  Endocrine: Negative for polyuria.  Genitourinary: Negative for decreased urine volume, dysuria, pelvic pain, urgency, vaginal bleeding and vaginal discharge.  Musculoskeletal: Negative for back pain.  Skin: Negative for rash.  Neurological: Negative for dizziness, light-headedness and headaches.     Physical Exam Updated Vital Signs BP 110/87   Pulse (!) 47   Temp 97.8 F (36.6 C) (Oral)   Resp 17   SpO2 100%   Physical Exam Vitals signs and nursing note reviewed.  Exam conducted with a chaperone present Vivi Ferns RN).  Constitutional:      Appearance: Normal appearance.  HENT:     Head: Normocephalic.     Mouth/Throat:     Mouth: Mucous membranes are moist.  Eyes:     Conjunctiva/sclera: Conjunctivae normal.  Cardiovascular:     Rate and Rhythm: Normal rate and regular rhythm.     Heart sounds: Normal heart sounds.  Pulmonary:     Effort: Pulmonary effort is normal.  Abdominal:     General: Bowel sounds are normal.     Palpations: Abdomen is soft.     Tenderness: There is abdominal tenderness in the suprapubic area. There is no right CVA tenderness, left CVA tenderness, guarding or rebound. Negative signs include Murphy's sign and Rovsing's sign.  Genitourinary:    Vagina: Normal.      Cervix: Normal.     Uterus: Normal.      Adnexa: Right adnexa normal and left adnexa normal.  Skin:    General: Skin is warm and dry.     Capillary Refill: Capillary refill takes less than 2 seconds.  Neurological:     General: No focal deficit present.     Mental Status: She is alert.  Psychiatric:        Mood and Affect: Mood normal.      ED Treatments / Results  Labs (all labs ordered are listed, but only abnormal results are displayed) Labs Reviewed  COMPREHENSIVE METABOLIC PANEL - Abnormal; Notable for the following components:      Result Value   CO2 21 (*)    Glucose, Bld 131 (*)    BUN 5 (*)    Total Protein 8.2 (*)    All other components within normal limits  WET PREP, GENITAL  LIPASE, BLOOD  CBC  URINALYSIS, ROUTINE W REFLEX MICROSCOPIC  I-STAT BETA HCG BLOOD, ED ([facility], [facility], [facility] ONLY)  GC/CHLAMYDIA PROBE AMP ([place]) NOT AT [facility]    EKG None  Radiology No results found.  Procedures Procedures (including critical care time)  Medications Ordered in ED Medications  sodium chloride flush (NS) 0.9 % injection 3 mL (has no administration in time range)  sodium chloride 0.9 % bolus 1,000 mL (1,000 mLs Intravenous New Bag/Given 12/14/18 1403)  ondansetron (ZOFRAN) injection 4 mg (4 mg Intravenous Given 12/14/18 1404)  ketorolac (TORADOL) 15 MG/ML injection 15 mg (15 mg Intravenous Given 12/14/18 1508)     Initial Impression / Assessment and Plan / ED Course  I have reviewed the triage vital signs and the nursing notes.  Pertinent labs & imaging results that were available during my care of the patient were reviewed by me and considered in my medical decision making (see chart for details).        Based on review of vitals, medical screening exam, lab work and/or imaging, there does not appear to be an acute, emergent etiology for the patient's symptoms. Counseled pt on good return precautions and encouraged both PCP and ED follow-up as needed.  Prior  to discharge, I also discussed incidental imaging findings with patient in detail and advised appropriate, recommended follow-up in detail.  Clinical Impression: 1. Generalized abdominal pain     Disposition: Discharge  Prior to providing a prescription for a controlled substance, I independently reviewed the patient's recent prescription history on the [place] Controlled Substance Reporting System. The patient had no recent or regular prescriptions and was deemed appropriate for a brief, less than 3 day  prescription of narcotic for acute analgesia.  This note was prepared with assistance of Conservation officer, historic buildingsDragon voice recognition software. Occasional wrong-word or sound-a-like substitutions may have occurred due to the inherent limitations of voice recognition software.   Final Clinical Impressions(s) / ED Diagnoses   Final diagnoses:  None    ED Discharge Orders    None       Jeral PinchMcLean, Aurore Redinger A, PA-C 12/14/18 2102    Rolan BuccoBelfi, Melanie, MD 12/14/18 2119

## 2018-12-14 NOTE — ED Notes (Signed)
Pelvic cart at bedside. 

## 2018-12-16 LAB — GC/CHLAMYDIA PROBE AMP (~~LOC~~) NOT AT ARMC
Chlamydia: NEGATIVE
Neisseria Gonorrhea: NEGATIVE

## 2018-12-18 LAB — URINE CULTURE: Culture: 80000 — AB

## 2018-12-19 NOTE — Progress Notes (Signed)
ED Antimicrobial Stewardship Positive Culture Follow Up   Teresa Perry is an 33 y.o. female who presented to [facility] on 12/14/2018 with a chief complaint of  Chief Complaint  Patient presents with  . Abdominal Pain  . Emesis    Recent Results (from the past 720 hour(s))  Wet prep, genital     Status: Abnormal   Collection Time: 12/14/18  3:22 PM   Specimen: PATH Cytology Cervicovaginal Ancillary Only  Result Value Ref Range Status   Yeast Wet Prep HPF POC NONE SEEN NONE SEEN Final   Trich, Wet Prep NONE SEEN NONE SEEN Final   Clue Cells Wet Prep HPF POC PRESENT (A) NONE SEEN Final   WBC, Wet Prep HPF POC FEW (A) NONE SEEN Final   Sperm NONE SEEN  Final    Comment: Performed at [facility], [address] [place], [place] [address]  Urine culture     Status: Abnormal   Collection Time: 12/14/18  4:15 PM   Specimen: Urine, Random  Result Value Ref Range Status   Specimen Description URINE, RANDOM  Final   Special Requests   Final    NONE Performed at [facility], [place] [address], [place],  [address]    Culture 80,000 COLONIES/mL ENTEROCOCCUS FAECALIS (A)  Final   Report Status 12/18/2018 FINAL  Final   Organism ID, Bacteria ENTEROCOCCUS FAECALIS (A)  Final      Susceptibility   Enterococcus faecalis - MIC*    AMPICILLIN <=2 SENSITIVE Sensitive     LEVOFLOXACIN 0.5 SENSITIVE Sensitive     NITROFURANTOIN <=16 SENSITIVE Sensitive     VANCOMYCIN 1 SENSITIVE Sensitive     * 80,000 COLONIES/mL ENTEROCOCCUS FAECALIS   No urinary symptoms  No abx indicated  ED Provider: Domenic Moras, PA  Wynell Balloon 12/19/2018, 2:34 PM Clinical Pharmacist Monday - Friday phone -  [phone] Saturday - Sunday phone - [phone]

## 2019-01-08 DIAGNOSIS — N760 Acute vaginitis: Secondary | ICD-10-CM | POA: Diagnosis not present

## 2019-01-08 DIAGNOSIS — F321 Major depressive disorder, single episode, moderate: Secondary | ICD-10-CM | POA: Diagnosis not present

## 2019-01-08 DIAGNOSIS — N390 Urinary tract infection, site not specified: Secondary | ICD-10-CM | POA: Diagnosis not present

## 2019-01-08 DIAGNOSIS — K529 Noninfective gastroenteritis and colitis, unspecified: Secondary | ICD-10-CM | POA: Diagnosis not present

## 2019-02-01 DIAGNOSIS — U071 COVID-19: Secondary | ICD-10-CM | POA: Diagnosis not present

## 2019-02-02 ENCOUNTER — Ambulatory Visit: Payer: BC Managed Care – PPO | Attending: Internal Medicine

## 2019-03-25 DIAGNOSIS — Z01419 Encounter for gynecological examination (general) (routine) without abnormal findings: Secondary | ICD-10-CM | POA: Diagnosis not present

## 2019-03-25 DIAGNOSIS — Z6831 Body mass index (BMI) 31.0-31.9, adult: Secondary | ICD-10-CM | POA: Diagnosis not present

## 2019-03-25 DIAGNOSIS — Z3040 Encounter for surveillance of contraceptives, unspecified: Secondary | ICD-10-CM | POA: Diagnosis not present

## 2020-01-26 DIAGNOSIS — Z0000 Encounter for general adult medical examination without abnormal findings: Secondary | ICD-10-CM | POA: Diagnosis not present

## 2020-01-26 DIAGNOSIS — R7989 Other specified abnormal findings of blood chemistry: Secondary | ICD-10-CM | POA: Diagnosis not present

## 2020-01-30 NOTE — L&D Delivery Note (Signed)
Delivery Note At 10:20 AM a viable female was delivered via VBAC, Spontaneous (Presentation: Right Occiput Anterior).  APGAR: 9, 9; weight pend.   Placenta status: pathology (previa, resolved)  Cord:   with the following complications: none  Brisk bleeding with placenta delivery, manually extracted with expedite, uterine cavity explored and clear of clot and debris. Uterine scar intact.  Bleeding improved with placenta delivery and pitocin.  Anesthesia: Epidural Episiotomy: None Lacerations: None Est. Blood Loss (mL): 600  Mom to postpartum.  Baby to Couplet care / Skin to Skin.  Lyn Henri 09/18/2020, 10:45 AM

## 2020-02-02 DIAGNOSIS — Z3A01 Less than 8 weeks gestation of pregnancy: Secondary | ICD-10-CM | POA: Diagnosis not present

## 2020-02-02 DIAGNOSIS — A6000 Herpesviral infection of urogenital system, unspecified: Secondary | ICD-10-CM | POA: Diagnosis not present

## 2020-02-02 DIAGNOSIS — Z0000 Encounter for general adult medical examination without abnormal findings: Secondary | ICD-10-CM | POA: Diagnosis not present

## 2020-02-02 DIAGNOSIS — Z1331 Encounter for screening for depression: Secondary | ICD-10-CM | POA: Diagnosis not present

## 2020-02-02 DIAGNOSIS — Z1339 Encounter for screening examination for other mental health and behavioral disorders: Secondary | ICD-10-CM | POA: Diagnosis not present

## 2020-02-17 DIAGNOSIS — Z9889 Other specified postprocedural states: Secondary | ICD-10-CM | POA: Diagnosis not present

## 2020-02-17 DIAGNOSIS — A609 Anogenital herpesviral infection, unspecified: Secondary | ICD-10-CM | POA: Diagnosis not present

## 2020-02-17 DIAGNOSIS — N911 Secondary amenorrhea: Secondary | ICD-10-CM | POA: Diagnosis not present

## 2020-02-24 DIAGNOSIS — Z3481 Encounter for supervision of other normal pregnancy, first trimester: Secondary | ICD-10-CM | POA: Diagnosis not present

## 2020-02-24 DIAGNOSIS — Z3685 Encounter for antenatal screening for Streptococcus B: Secondary | ICD-10-CM | POA: Diagnosis not present

## 2020-02-24 LAB — OB RESULTS CONSOLE RPR: RPR: NONREACTIVE

## 2020-02-24 LAB — OB RESULTS CONSOLE HEPATITIS B SURFACE ANTIGEN: Hepatitis B Surface Ag: NEGATIVE

## 2020-02-24 LAB — OB RESULTS CONSOLE RUBELLA ANTIBODY, IGM: Rubella: IMMUNE

## 2020-03-03 ENCOUNTER — Inpatient Hospital Stay (HOSPITAL_COMMUNITY)
Admission: AD | Admit: 2020-03-03 | Discharge: 2020-03-03 | Disposition: A | Discharge status: Home / Self Care | Payer: BC Managed Care – PPO | Attending: Obstetrics and Gynecology | Admitting: Obstetrics and Gynecology

## 2020-03-03 ENCOUNTER — Encounter (HOSPITAL_COMMUNITY): Payer: Self-pay | Admitting: *Deleted

## 2020-03-03 DIAGNOSIS — M7918 Myalgia, other site: Secondary | ICD-10-CM | POA: Diagnosis not present

## 2020-03-03 DIAGNOSIS — O99891 Other specified diseases and conditions complicating pregnancy: Secondary | ICD-10-CM | POA: Diagnosis not present

## 2020-03-03 DIAGNOSIS — Z87891 Personal history of nicotine dependence: Secondary | ICD-10-CM | POA: Diagnosis not present

## 2020-03-03 DIAGNOSIS — R109 Unspecified abdominal pain: Secondary | ICD-10-CM | POA: Diagnosis not present

## 2020-03-03 DIAGNOSIS — Z3490 Encounter for supervision of normal pregnancy, unspecified, unspecified trimester: Secondary | ICD-10-CM

## 2020-03-03 DIAGNOSIS — Z791 Long term (current) use of non-steroidal anti-inflammatories (NSAID): Secondary | ICD-10-CM | POA: Insufficient documentation

## 2020-03-03 DIAGNOSIS — O26891 Other specified pregnancy related conditions, first trimester: Secondary | ICD-10-CM | POA: Diagnosis not present

## 2020-03-03 DIAGNOSIS — Z3A10 10 weeks gestation of pregnancy: Secondary | ICD-10-CM | POA: Diagnosis not present

## 2020-03-03 LAB — CBC
HCT: 35.3 % — ABNORMAL LOW (ref 36.0–46.0)
Hemoglobin: 11.8 g/dL — ABNORMAL LOW (ref 12.0–15.0)
MCH: 27.1 pg (ref 26.0–34.0)
MCHC: 33.4 g/dL (ref 30.0–36.0)
MCV: 81.1 fL (ref 80.0–100.0)
Platelets: 274 10*3/uL (ref 150–400)
RBC: 4.35 MIL/uL (ref 3.87–5.11)
RDW: 13.6 % (ref 11.5–15.5)
WBC: 3.6 10*3/uL — ABNORMAL LOW (ref 4.0–10.5)
nRBC: 0 % (ref 0.0–0.2)

## 2020-03-03 LAB — COMPREHENSIVE METABOLIC PANEL
ALT: 13 U/L (ref 0–44)
AST: 18 U/L (ref 15–41)
Albumin: 3.8 g/dL (ref 3.5–5.0)
Alkaline Phosphatase: 36 U/L — ABNORMAL LOW (ref 38–126)
Anion gap: 9 (ref 5–15)
BUN: <5 mg/dL — ABNORMAL LOW (ref 6–20)
CO2: 23 mmol/L (ref 22–32)
Calcium: 9.5 mg/dL (ref 8.9–10.3)
Chloride: 104 mmol/L (ref 98–111)
Creatinine, Ser: 0.58 mg/dL (ref 0.44–1.00)
GFR, Estimated: >60 mL/min (ref >60)
Glucose, Bld: 100 mg/dL — ABNORMAL HIGH (ref 70–99)
Potassium: 3.8 mmol/L (ref 3.5–5.1)
Sodium: 136 mmol/L (ref 135–145)
Total Bilirubin: 0.5 mg/dL (ref 0.3–1.2)
Total Protein: 7.3 g/dL (ref 6.5–8.1)

## 2020-03-03 LAB — URINALYSIS, ROUTINE W REFLEX MICROSCOPIC
Bilirubin Urine: NEGATIVE
Glucose, UA: NEGATIVE mg/dL
Ketones, ur: NEGATIVE mg/dL
Leukocytes,Ua: NEGATIVE
Nitrite: NEGATIVE
Protein, ur: NEGATIVE mg/dL
Specific Gravity, Urine: 1.025 (ref 1.005–1.030)
pH: 7.5 (ref 5.0–8.0)

## 2020-03-03 LAB — URINALYSIS, MICROSCOPIC (REFLEX)

## 2020-03-03 LAB — WET PREP, GENITAL
Sperm: NONE SEEN
Trich, Wet Prep: NONE SEEN
Yeast Wet Prep HPF POC: NONE SEEN

## 2020-03-03 LAB — OB RESULTS CONSOLE GC/CHLAMYDIA
Chlamydia: NEGATIVE
Gonorrhea: NEGATIVE

## 2020-03-03 LAB — HCG, QUANTITATIVE, PREGNANCY: hCG, Beta Chain, Quant, S: 131502 m[IU]/mL — ABNORMAL HIGH (ref <5)

## 2020-03-03 NOTE — Discharge Instructions (Signed)

## 2020-03-03 NOTE — MAU Provider Note (Signed)
History     CSN: [id number]  Arrival date and time: 03/03/20 8182  Event Date/Time  First Provider Initiated Contact with Patient 03/03/20 1021     Chief Complaint  Patient presents with  . Abdominal Pain   HPI Teresa Perry is a 35 y.o. [id number] at [redacted]w[redacted]d who presents to MAU with chief complaint of generalized left sided-pain which occurs at night when she lies on her left side. She describes her pain as a "cramp". Onset Tuesday 03/01/2020 and occurring intermittently since that time. She denies pain on arrival to MAU. She denies alleviating factors. She has not taken medication or tried other treatments for this complaint.  Patient reports confirmation of IUP at 8 weeks via bedside ultrasound in office. She endorses breast tenderness, abdominal cramping and nausea. She became concerned when her symptoms improved then resolved. She reports to CNM she became concerned the resolution of her symptoms meant she had lost the baby.  She denies lower abdominal pain, low back pain, vaginal bleeding, dysuria, fever and recent illness.  She receives care with [facility].  OB History    Gravida  7   Para  4   Term  4   Preterm      AB  2   Living  4     SAB      IAB  1   Ectopic      Multiple      Live Births  4           Past Medical History:  Diagnosis Date  . Chlamydia   . No pertinent past medical history   . NVD (normal vaginal delivery) 09/07/2010  . Urinary tract infection     Past Surgical History:  Procedure Laterality Date  . CESAREAN SECTION    . THERAPEUTIC ABORTION      History reviewed. No pertinent family history.  Social History   Tobacco Use  . Smoking status: Former Smoker    Packs/day: 0.25  . Smokeless tobacco: Never Used  Substance Use Topics  . Alcohol use: Not Currently    Comment: Occas.  . Drug use: No    Allergies: No Known Allergies  Medications Prior to Admission  Medication Sig Dispense Refill Last Dose  .  Prenatal Vit-Fe Fumarate-FA (PRENATAL MULTIVITAMIN) TABS tablet Take 1 tablet by mouth daily at 12 noon.     Marland Kitchen amoxicillin-clavulanate (AUGMENTIN) 875-125 MG tablet Take 1 tablet by mouth every 12 (twelve) hours. 14 tablet 0 More than a month at Unknown time  . HYDROcodone-acetaminophen (NORCO/VICODIN) 5-325 MG tablet Take 1 tablet by mouth every 6 (six) hours as needed for severe pain. 8 tablet 0 More than a month at Unknown time  . naproxen (NAPROSYN) 500 MG tablet Take 1 tablet (500 mg total) by mouth 2 (two) times daily. 30 tablet 0 More than a month at Unknown time  . ondansetron (ZOFRAN) 4 MG tablet Take 1 tablet (4 mg total) by mouth every 6 (six) hours. 12 tablet 0 More than a month at Unknown time    Review of Systems  Gastrointestinal: Positive for abdominal pain.       Generalized left abdomen  All other systems reviewed and are negative.  Physical Exam   Blood pressure 133/72, pulse 92, temperature 98.5 F (36.9 C), resp. rate 18, height 5\' 6"  (1.676 m), weight 82.1 kg, last menstrual period 12/19/2019.  Physical Exam Vitals and nursing note reviewed. Exam conducted with a chaperone present.  Abdominal:  General: Abdomen is flat. Bowel sounds are normal.     Palpations: Abdomen is soft.     Tenderness: There is no abdominal tenderness. There is no right CVA tenderness or left CVA tenderness.    Skin:    Capillary Refill: Capillary refill takes less than 2 seconds.  Neurological:     Mental Status: She is alert.  Psychiatric:        Mood and Affect: Mood normal.        Behavior: Behavior normal.     MAU Course  Procedures  -Clue cells not c/w physical exam or patient report. Will not treat -IUP previously confirmed in office -Live IUP, FHT 150 bpm via BSUS. Patient informed that the ultrasound is considered a limited obstetric ultrasound and is not intended to be a complete ultrasound exam.  Patient also informed that the ultrasound is not being completed with  the intent of assessing for fetal or placental anomalies or any pelvic abnormalities.  Explained that the purpose of today's ultrasound is to assess for fetal heart rate.  Patient acknowledges the purpose of the exam and the limitations of the study.    Patient Vitals for the past 24 hrs:  BP Temp Pulse Resp Height Weight  03/03/20 0947 133/72 98.5 F (36.9 C) 92 18 5\' 6"  (1.676 m) 82.1 kg   Orders Placed This Encounter  Procedures  . Wet prep, genital  . Urinalysis, Routine w reflex microscopic Urine, Clean Catch  . CBC  . Comprehensive metabolic panel  . hCG, quantitative, pregnancy  . Urinalysis, Microscopic (reflex)  . Nursing communication  . Discharge patient   Results for orders placed or performed during the hospital encounter of 03/03/20 (from the past 24 hour(s))  CBC     Status: Abnormal   Collection Time: 03/03/20 10:04 AM  Result Value Ref Range   WBC 3.6 (L) 4.0 - 10.5 K/uL   RBC 4.35 3.87 - 5.11 MIL/uL   Hemoglobin 11.8 (L) 12.0 - 15.0 g/dL   HCT 05/01/20 (L) 40.9 - 81.1 %   MCV 81.1 80.0 - 100.0 fL   MCH 27.1 26.0 - 34.0 pg   MCHC 33.4 30.0 - 36.0 g/dL   RDW 91.4 78.2 - 95.6 %   Platelets 274 150 - 400 K/uL   nRBC 0.0 0.0 - 0.2 %  Comprehensive metabolic panel     Status: Abnormal   Collection Time: 03/03/20 10:04 AM  Result Value Ref Range   Sodium 136 135 - 145 mmol/L   Potassium 3.8 3.5 - 5.1 mmol/L   Chloride 104 98 - 111 mmol/L   CO2 23 22 - 32 mmol/L   Glucose, Bld 100 (H) 70 - 99 mg/dL   BUN <5 (L) 6 - 20 mg/dL   Creatinine, Ser 05/01/20 0.44 - 1.00 mg/dL   Calcium 9.5 8.9 - 0.86 mg/dL   Total Protein 7.3 6.5 - 8.1 g/dL   Albumin 3.8 3.5 - 5.0 g/dL   AST 18 15 - 41 U/L   ALT 13 0 - 44 U/L   Alkaline Phosphatase 36 (L) 38 - 126 U/L   Total Bilirubin 0.5 0.3 - 1.2 mg/dL   GFR, Estimated 57.8 >46 mL/min   Anion gap 9 5 - 15  hCG, quantitative, pregnancy     Status: Abnormal   Collection Time: 03/03/20 10:04 AM  Result Value Ref Range   hCG, Beta Chain,  Quant, S 131,502 (H) <5 mIU/mL  Urinalysis, Routine w reflex microscopic Urine, Clean Catch  Status: Abnormal   Collection Time: 03/03/20 10:07 AM  Result Value Ref Range   Color, Urine YELLOW YELLOW   APPearance CLOUDY (A) CLEAR   Specific Gravity, Urine 1.025 1.005 - 1.030   pH 7.5 5.0 - 8.0   Glucose, UA NEGATIVE NEGATIVE mg/dL   Hgb urine dipstick TRACE (A) NEGATIVE   Bilirubin Urine NEGATIVE NEGATIVE   Ketones, ur NEGATIVE NEGATIVE mg/dL   Protein, ur NEGATIVE NEGATIVE mg/dL   Nitrite NEGATIVE NEGATIVE   Leukocytes,Ua NEGATIVE NEGATIVE  Urinalysis, Microscopic (reflex)     Status: Abnormal   Collection Time: 03/03/20 10:07 AM  Result Value Ref Range   RBC / HPF 0-5 0 - 5 RBC/hpf   WBC, UA 0-5 0 - 5 WBC/hpf   Bacteria, UA MANY (A) NONE SEEN   Squamous Epithelial / LPF 11-20 0 - 5   Mucus PRESENT    Amorphous Crystal PRESENT   Wet prep, genital     Status: Abnormal   Collection Time: 03/03/20 10:54 AM   Specimen: Vaginal  Result Value Ref Range   Yeast Wet Prep HPF POC NONE SEEN NONE SEEN   Trich, Wet Prep NONE SEEN NONE SEEN   Clue Cells Wet Prep HPF POC PRESENT (A) NONE SEEN   WBC, Wet Prep HPF POC MANY (A) NONE SEEN   Sperm NONE SEEN    Assessment and Plan  --35 y.o. [id number] with SIUP at [redacted]w[redacted]d  --Musculoskeletal pain --Given list of safe medications in pregnancy --Discharge home in stable condition  Calvert Cantor, CNM 03/03/2020, 3:49 PM

## 2020-03-03 NOTE — MAU Note (Signed)
Pt report she started having cramping on Tuesday. [redacted] weeks pregnant. Denies any vag bleeding or discharge.

## 2020-03-04 LAB — GC/CHLAMYDIA PROBE AMP (~~LOC~~) NOT AT ARMC
Chlamydia: NEGATIVE
Comment: NEGATIVE
Comment: NORMAL
Neisseria Gonorrhea: NEGATIVE

## 2020-03-10 DIAGNOSIS — Z01411 Encounter for gynecological examination (general) (routine) with abnormal findings: Secondary | ICD-10-CM | POA: Diagnosis not present

## 2020-03-10 DIAGNOSIS — Z3400 Encounter for supervision of normal first pregnancy, unspecified trimester: Secondary | ICD-10-CM | POA: Diagnosis not present

## 2020-03-10 DIAGNOSIS — Z113 Encounter for screening for infections with a predominantly sexual mode of transmission: Secondary | ICD-10-CM | POA: Diagnosis not present

## 2020-03-15 DIAGNOSIS — Z3A12 12 weeks gestation of pregnancy: Secondary | ICD-10-CM | POA: Diagnosis not present

## 2020-03-15 DIAGNOSIS — Z3481 Encounter for supervision of other normal pregnancy, first trimester: Secondary | ICD-10-CM | POA: Diagnosis not present

## 2020-03-15 DIAGNOSIS — Z3682 Encounter for antenatal screening for nuchal translucency: Secondary | ICD-10-CM | POA: Diagnosis not present

## 2020-05-02 DIAGNOSIS — Z363 Encounter for antenatal screening for malformations: Secondary | ICD-10-CM | POA: Diagnosis not present

## 2020-05-02 DIAGNOSIS — Z3A19 19 weeks gestation of pregnancy: Secondary | ICD-10-CM | POA: Diagnosis not present

## 2020-06-01 ENCOUNTER — Encounter (HOSPITAL_COMMUNITY): Payer: Self-pay | Admitting: Obstetrics and Gynecology

## 2020-06-01 ENCOUNTER — Other Ambulatory Visit: Payer: Self-pay

## 2020-06-01 ENCOUNTER — Inpatient Hospital Stay (HOSPITAL_COMMUNITY)
Admission: AD | Admit: 2020-06-01 | Discharge: 2020-06-10 | DRG: 833 | Disposition: A | Discharge status: Home / Self Care | Payer: BC Managed Care – PPO | Attending: Obstetrics and Gynecology | Admitting: Obstetrics and Gynecology

## 2020-06-01 DIAGNOSIS — O4410 Placenta previa with hemorrhage, unspecified trimester: Secondary | ICD-10-CM | POA: Diagnosis present

## 2020-06-01 DIAGNOSIS — Z3A23 23 weeks gestation of pregnancy: Secondary | ICD-10-CM | POA: Diagnosis not present

## 2020-06-01 DIAGNOSIS — O4411 Placenta previa with hemorrhage, first trimester: Secondary | ICD-10-CM | POA: Diagnosis present

## 2020-06-01 DIAGNOSIS — Z20822 Contact with and (suspected) exposure to covid-19: Secondary | ICD-10-CM | POA: Diagnosis present

## 2020-06-01 DIAGNOSIS — O34219 Maternal care for unspecified type scar from previous cesarean delivery: Secondary | ICD-10-CM | POA: Diagnosis present

## 2020-06-01 DIAGNOSIS — D649 Anemia, unspecified: Secondary | ICD-10-CM | POA: Diagnosis present

## 2020-06-01 DIAGNOSIS — O4412 Placenta previa with hemorrhage, second trimester: Principal | ICD-10-CM

## 2020-06-01 DIAGNOSIS — O4432 Partial placenta previa with hemorrhage, second trimester: Secondary | ICD-10-CM | POA: Diagnosis not present

## 2020-06-01 DIAGNOSIS — O99012 Anemia complicating pregnancy, second trimester: Secondary | ICD-10-CM | POA: Diagnosis present

## 2020-06-01 DIAGNOSIS — O322XX0 Maternal care for transverse and oblique lie, not applicable or unspecified: Secondary | ICD-10-CM | POA: Diagnosis present

## 2020-06-01 DIAGNOSIS — Z87891 Personal history of nicotine dependence: Secondary | ICD-10-CM

## 2020-06-01 NOTE — MAU Provider Note (Signed)
Chief Complaint:  Vaginal Bleeding   Event Date/Time   First Provider Initiated Contact with Patient 06/01/20 2348     HPI: Teresa Perry is a 35 y.o. [id number] at 53w4dwho presents to maternity admissions reporting painless Vaginal Bleeding since 2230.  Has some pressure but no pain.   Has known placenta previa since 20 weeks, scheduled to repeat US at 28 wks.  Has been on pelvic rest. . She reports good fetal movement, denies LOF, vaginal itching/burning, urinary symptoms, h/a, dizziness, n/v, diarrhea, constipation or fever/chills.    Vaginal Bleeding The patient's primary symptoms include vaginal bleeding. The patient's pertinent negatives include no genital itching, genital lesions, genital odor or pelvic pain. This is a new problem. The current episode started today. The problem occurs intermittently. The problem has been gradually improving. The patient is experiencing no pain. Pertinent negatives include no abdominal pain, back pain, chills, constipation, diarrhea, dysuria, fever, frequency, nausea or vomiting. The vaginal discharge was bloody. The vaginal bleeding is typical of menses. She has been passing clots. She has not been passing tissue. Nothing aggravates the symptoms. She has tried nothing for the symptoms. She is not sexually active.    RN Note Vaginal bleeding at 2230. Saw bright blood in toilet and then when wiped. Unsure if has had anymore bleeding. Hx placenta previa. No pain but some pelvic pressure. Good FM. Has had nothing in vagina since finding out about previa  Past Medical History: Past Medical History:  Diagnosis Date  . Chlamydia   . No pertinent past medical history   . NVD (normal vaginal delivery) 09/07/2010  . Urinary tract infection     Past obstetric history: OB History  Gravida Para Term Preterm AB Living  7 4 4   2 4   SAB IAB Ectopic Multiple Live Births    1     4    # Outcome Date GA Lbr Len/2nd Weight Sex Delivery Anes PTL Lv  7 Current            6 Term 09/07/10 [redacted]w[redacted]d 15:50 / 00:17 3731 g F Vag-Spont EPI  LIV     Birth Comments: none  5 AB           4 Term      CS-LTranv   LIV  3 Term      Vag-Spont   LIV  2 Term      Vag-Spont   LIV  1 IAB             Past Surgical History: Past Surgical History:  Procedure Laterality Date  . CESAREAN SECTION    . THERAPEUTIC ABORTION      Family History: History reviewed. No pertinent family history.  Social History: Social History   Tobacco Use  . Smoking status: Former Smoker    Packs/day: 0.25  . Smokeless tobacco: Never Used  Substance Use Topics  . Alcohol use: Not Currently    Comment: Occas.  . Drug use: No    Allergies: No Known Allergies  Meds:  Medications Prior to Admission  Medication Sig Dispense Refill Last Dose  . promethazine (PHENERGAN) 25 MG tablet Take 25 mg by mouth every 6 (six) hours as needed for nausea or vomiting.   Past Week at Unknown time  . Prenatal Vit-Fe Fumarate-FA (PRENATAL MULTIVITAMIN) TABS tablet Take 1 tablet by mouth daily at 12 noon.       I have reviewed patient's Past Medical Hx, Surgical Hx, Family Hx, Social Hx, medications and  allergies.   ROS:  Review of Systems  Constitutional: Negative for chills and fever.  Gastrointestinal: Negative for abdominal pain, constipation, diarrhea, nausea and vomiting.  Genitourinary: Positive for vaginal bleeding. Negative for dysuria, frequency and pelvic pain.  Musculoskeletal: Negative for back pain.   Other systems negative  Physical Exam   Patient Vitals for the past 24 hrs:  BP Temp Pulse Resp Height Weight  06/01/20 2330 138/85 -- 78 -- -- --  06/01/20 2328 -- 98 F (36.7 C) -- 16 5\' 6"  (1.676 m) 87.5 kg   Constitutional: Well-developed, well-nourished female in no acute distress.  Cardiovascular: normal rate and rhythm Respiratory: normal effort, clear to auscultation bilaterally GI: Abd soft, non-tender, gravid appropriate for gestational age.   No rebound or guarding. MS:  Extremities nontender, no edema, normal ROM Neurologic: Alert and oriented x 4.  GU: Neg CVAT.  PELVIC EXAM: Cervix difficult to see due to its position, edges visible, unable to see os.  Moderate to large blood in vault, half clotted.  No active flow visualized   FHT:  Baseline 140 , moderate variability, accelerations present, no decelerations Contractions: no contractions tracing   Labs: Results for orders placed or performed during the hospital encounter of 06/01/20 (from the past 24 hour(s))  Type and screen [facility]     Status: None   Collection Time: 06/02/20  2:02 AM  Result Value Ref Range   ABO/RH(D) B POS    Antibody Screen NEG    Sample Expiration      06/05/2020,2359 Performed at [facility], [address], [place], [place]   CBC on admission     Status: Abnormal   Collection Time: 06/02/20  2:02 AM  Result Value Ref Range   WBC 6.1 4.0 - 10.5 K/uL   RBC 4.12 3.87 - 5.11 MIL/uL   Hemoglobin 11.2 (L) 12.0 - 15.0 g/dL   HCT 08/02/20 (L) 88.4 - 16.6 %   MCV 85.7 80.0 - 100.0 fL   MCH 27.2 26.0 - 34.0 pg   MCHC 31.7 30.0 - 36.0 g/dL   RDW 06.3 01.6 - 01.0 %   Platelets 293 150 - 400 K/uL   nRBC 0.0 0.0 - 0.2 %     Imaging:  93.2 ordered Posterior Placenta Previa Fetua transverse, head to right AF WNL Cervical Length 4.7cm  MAU Course/MDM: I have ordered labs and reviewed results.  US findings reviewed with Dr [place], Dr Despina Hidden, and patient Discussed with patient recommendation for admission for observation NST reviewed, reassuring for gestational age Consult Dr Henderson Cloud with presentation, exam findings and test results. He recommends admission for observation and Betamethasone.  Dr Despina Hidden notified and agrees Treatments in MAU included EFM, Betamethasone, IV access..    Assessment: Single IUP at. [redacted]w[redacted]d Placenta previa, posterior Bleeding in the second trimester in setting of previa Cervical length 4.7cm  Plan: Admit to [facility]  unit Bedrest with BRP NST q shift with continuous toco IV access Betamethasone series MD to follow  [redacted]w[redacted]d CNM, MSN Certified Nurse-Midwife 06/01/2020 11:49 PM

## 2020-06-01 NOTE — MAU Note (Addendum)
Vaginal bleeding at 2230. Saw bright blood in toilet and then when wiped. Unsure if has had anymore bleeding. Hx placenta previa. No pain but some pelvic pressure. Good FM. Has had nothing in vagina since finding out about previa

## 2020-06-02 ENCOUNTER — Inpatient Hospital Stay (HOSPITAL_BASED_OUTPATIENT_CLINIC_OR_DEPARTMENT_OTHER): Payer: BC Managed Care – PPO

## 2020-06-02 DIAGNOSIS — O4412 Placenta previa with hemorrhage, second trimester: Secondary | ICD-10-CM

## 2020-06-02 DIAGNOSIS — O4402 Placenta previa specified as without hemorrhage, second trimester: Secondary | ICD-10-CM | POA: Diagnosis not present

## 2020-06-02 DIAGNOSIS — Z3A24 24 weeks gestation of pregnancy: Secondary | ICD-10-CM | POA: Diagnosis not present

## 2020-06-02 DIAGNOSIS — O4411 Placenta previa with hemorrhage, first trimester: Secondary | ICD-10-CM | POA: Diagnosis not present

## 2020-06-02 DIAGNOSIS — O34219 Maternal care for unspecified type scar from previous cesarean delivery: Secondary | ICD-10-CM | POA: Diagnosis not present

## 2020-06-02 DIAGNOSIS — Z3A23 23 weeks gestation of pregnancy: Secondary | ICD-10-CM

## 2020-06-02 DIAGNOSIS — O322XX0 Maternal care for transverse and oblique lie, not applicable or unspecified: Secondary | ICD-10-CM

## 2020-06-02 DIAGNOSIS — O4432 Partial placenta previa with hemorrhage, second trimester: Secondary | ICD-10-CM | POA: Diagnosis not present

## 2020-06-02 DIAGNOSIS — D649 Anemia, unspecified: Secondary | ICD-10-CM | POA: Diagnosis not present

## 2020-06-02 DIAGNOSIS — O4422 Partial placenta previa NOS or without hemorrhage, second trimester: Secondary | ICD-10-CM | POA: Diagnosis not present

## 2020-06-02 DIAGNOSIS — F4321 Adjustment disorder with depressed mood: Secondary | ICD-10-CM | POA: Insufficient documentation

## 2020-06-02 DIAGNOSIS — Z87891 Personal history of nicotine dependence: Secondary | ICD-10-CM | POA: Diagnosis not present

## 2020-06-02 DIAGNOSIS — O99012 Anemia complicating pregnancy, second trimester: Secondary | ICD-10-CM | POA: Diagnosis not present

## 2020-06-02 DIAGNOSIS — Z20822 Contact with and (suspected) exposure to covid-19: Secondary | ICD-10-CM | POA: Diagnosis not present

## 2020-06-02 DIAGNOSIS — D72819 Decreased white blood cell count, unspecified: Secondary | ICD-10-CM | POA: Insufficient documentation

## 2020-06-02 LAB — CBC
HCT: 35.3 % — ABNORMAL LOW (ref 36.0–46.0)
Hemoglobin: 11.2 g/dL — ABNORMAL LOW (ref 12.0–15.0)
MCH: 27.2 pg (ref 26.0–34.0)
MCHC: 31.7 g/dL (ref 30.0–36.0)
MCV: 85.7 fL (ref 80.0–100.0)
Platelets: 293 10*3/uL (ref 150–400)
RBC: 4.12 MIL/uL (ref 3.87–5.11)
RDW: 13.8 % (ref 11.5–15.5)
WBC: 6.1 10*3/uL (ref 4.0–10.5)
nRBC: 0 % (ref 0.0–0.2)

## 2020-06-02 LAB — RESP PANEL BY RT-PCR (FLU A&B, COVID) ARPGX2
Influenza A by PCR: NEGATIVE
Influenza B by PCR: NEGATIVE
SARS Coronavirus 2 by RT PCR: NEGATIVE

## 2020-06-02 LAB — TYPE AND SCREEN
ABO/RH(D): B POS
Antibody Screen: NEGATIVE

## 2020-06-02 MED ORDER — PRENATAL MULTIVITAMIN CH
1.0000 | ORAL_TABLET | Freq: Every day | ORAL | Status: DC
  Administered 2020-06-02 – 2020-06-10 (×9): 1 via ORAL
  Filled 2020-06-02 (×9): qty 1

## 2020-06-02 MED ORDER — ACETAMINOPHEN 325 MG PO TABS
650.0000 mg | ORAL_TABLET | ORAL | Status: DC | PRN
  Administered 2020-06-04 – 2020-06-09 (×4): 650 mg via ORAL
  Filled 2020-06-02 (×4): qty 2

## 2020-06-02 MED ORDER — CALCIUM CARBONATE ANTACID 500 MG PO CHEW: 2.0000 | CHEWABLE_TABLET | ORAL | Status: DC | PRN

## 2020-06-02 MED ORDER — DOCUSATE SODIUM 100 MG PO CAPS
100.0000 mg | ORAL_CAPSULE | Freq: Every day | ORAL | Status: DC
  Administered 2020-06-02 – 2020-06-06 (×5): 100 mg via ORAL
  Filled 2020-06-02 (×5): qty 1

## 2020-06-02 MED ORDER — ZOLPIDEM TARTRATE 5 MG PO TABS: 5.0000 mg | ORAL_TABLET | Freq: Every evening | ORAL | Status: DC | PRN

## 2020-06-02 MED ORDER — BETAMETHASONE SOD PHOS & ACET 6 (3-3) MG/ML IJ SUSP
12.0000 mg | INTRAMUSCULAR | Status: AC
  Administered 2020-06-02 – 2020-06-03 (×2): 12 mg via INTRAMUSCULAR
  Filled 2020-06-02: qty 5

## 2020-06-02 MED ORDER — LACTATED RINGERS IV SOLN: INTRAVENOUS | Status: DC

## 2020-06-02 NOTE — MAU Note (Signed)
Pt up to BR and reports scant amt dark blood on tissue when wiped

## 2020-06-02 NOTE — Progress Notes (Signed)
Patient is resting. Bleeding has stopped for now No contractions.  BP 118/70 (BP Location: Right Arm)   Pulse 83   Temp 98 F (36.7 C) (Oral)   Resp 18   Ht 5\' 6"  (1.676 m)   Wt 87.5 kg   LMP 12/19/2019   SpO2 100%   BMI 31.15 kg/m  Results for orders placed or performed during the hospital encounter of 06/01/20 (from the past 24 hour(s))  Resp Panel by RT-PCR (Flu A&B, Covid) Nasopharyngeal Swab     Status: None   Collection Time: 06/02/20  1:48 AM   Specimen: Nasopharyngeal Swab; Nasopharyngeal(NP) swabs in vial transport medium  Result Value Ref Range   SARS Coronavirus 2 by RT PCR NEGATIVE NEGATIVE   Influenza A by PCR NEGATIVE NEGATIVE   Influenza B by PCR NEGATIVE NEGATIVE  Type and screen [facility]     Status: None   Collection Time: 06/02/20  2:02 AM  Result Value Ref Range   ABO/RH(D) B POS    Antibody Screen NEG    Sample Expiration      06/05/2020,2359 Performed at [facility], [address], [place], [place]   CBC on admission     Status: Abnormal   Collection Time: 06/02/20  2:02 AM  Result Value Ref Range   WBC 6.1 4.0 - 10.5 K/uL   RBC 4.12 3.87 - 5.11 MIL/uL   Hemoglobin 11.2 (L) 12.0 - 15.0 g/dL   HCT 08/02/20 (L) 17.4 - 94.4 %   MCV 85.7 80.0 - 100.0 fL   MCH 27.2 26.0 - 34.0 pg   MCHC 31.7 30.0 - 36.0 g/dL   RDW 96.7 59.1 - 63.8 %   Platelets 293 150 - 400 K/uL   nRBC 0.0 0.0 - 0.2 %   Abdomen is soft and non tender No bleeding is noted  IUP at 23 w 5 days  Posterior Placenta Previa Prior Cesarean Section Status post first episode of bleeding  Stable for now MFM and NICU consult today  Discussed with patient possible need for C Section if heavy bleeding recurs  Complete steroid series

## 2020-06-02 NOTE — Consult Note (Signed)
MFM Consult  Teresa Perry is a 35 year old gravida 7 para 4 currently at 23 weeks and 5 days.  She was admitted last evening due to painless vaginal bleeding.  She has a known placenta previa that was found at her fetal anatomy scan at around 20 weeks.  She reports that the vaginal bleeding started after she used the bathroom.  She denies any recent intercourse.  She reports feeling fetal movements throughout the day and denies feeling any contractions.  Her labs on admission showed an H/H of 11.2 and 35.3.  Her platelet count was 293,000.  Her blood type is B+ and antibody screen was negative.  Due to vaginal bleeding with placenta previa, she was admitted to the hospital for observation and given a complete course of antenatal corticosteroids.  A limited ultrasound performed this morning shows that the fetus is in the breech presentation.  There was normal amniotic fluid noted with a maximal vertical pocket of 7.3 cm.  A posterior placenta previa is noted.  Only a small portion of the posterior placenta is covering the cervix.  The majority of the posterior placenta is away from the cervix.  The patient was advised that based on today's ultrasound, I anticipate that her placenta previa will most likely resolve later in her pregnancy.  Her cervical length measured 4.7 cm long without any signs of funneling.  There were no signs of placenta accreta noted today.  Her past obstetrical history includes a cesarean delivery in her first pregnancy due to nonreassuring fetal status.  She subsequently had 3 VBACs for the delivery of her next 3 children.  She denies any significant past medical or family history.  Due to vaginal bleeding with placenta previa, I would recommend that she remain in the hospital for the next 5 to 7 days until her bleeding resolves.  As she reports that her vaginal bleeding has decreased since being admitted to the hospital, she was advised that she may anticipate continued brown  vaginal discharge for the next few days.  She should receive a complete course of antenatal corticosteroids.  Tocolytics should be given should she experience frequent contractions.  Delivery at this time would only be indicated should she experienced significant hemorrhage.  The patient reports that she already has a follow-up ultrasound scheduled in your office on June 2.  The patient understands that should a placenta previa persist close to term (at between 34 to 36 weeks), that she may require a cesarean delivery at around 37 weeks.  She was reassured that based on the appearance of the placenta previa today, I anticipate that the placenta will most likely move away from the cervix later in her pregnancy.  As the patient reports that she works mostly from home, she was advised that she may continue working from home once she is discharged.  At the end of the consultation, the patient stated that all of her questions have been answered to her complete satisfaction.    Thank you for referring this patient for a Maternal-Fetal Medicine consultation.  Recommendations: Inpatient observation for the next 5 to 7 days Complete course of antenatal corticosteroids Tocolysis should she experience any contractions Repeat ultrasound to assess placental location

## 2020-06-02 NOTE — Progress Notes (Signed)
To [facility] via w/c. Update to Mia RN in [place] just before transfer.

## 2020-06-02 NOTE — Consult Note (Signed)
Neonatology Consult to Antenatal Patient:  I was asked by Dr. Vincente Poli to see this patient in order to provide antenatal counseling due to prematurity.  Mrs. Steuck was admitted this morning at 23 5/[redacted]  weeks GA due to vaginal bleeding from placental previa. She is currently not having active labor. Membranes are intact reassuring fetal status.  She is getting BMZ.  Mother is a multigravida with pregnancy otherwise complicated by tobacco use and previous chlamydia infection.  Husband present but sleeping soundly due to lack of recent sleep caring for her and 4 kids at home leading up to her admission.    I spoke with the patient. We discussed the worst case of delivery in the next 1-2 days, including usual DR management, possible respiratory complications and need for support, IV access, feedings (mother desires breast feeding, which was encouraged), LOS, Mortality and Morbidity, and long term outcomes. Ideally, bleeding will subside and pregnancy will be prolonged. General questions at this time were answered.  I//we would be glad to come back if she has more questions later.  Thank you for asking me to see this patient.  Dineen Kid Leary Roca, MD Neonatologist  The total length of face-to-face or floor/unit time for this encounter was 25 minutes. Counseling and/or coordination of care was 40 minutes of the above.

## 2020-06-02 NOTE — Plan of Care (Signed)
  Problem: Clinical Measurements: Goal: Ability to maintain clinical measurements within normal limits will improve Outcome: Progressing   Problem: Activity: Goal: Risk for activity intolerance will decrease Outcome: Progressing   Problem: Coping: Goal: Level of anxiety will decrease Outcome: Progressing   Problem: Pain Managment: Goal: General experience of comfort will improve Outcome: Progressing   

## 2020-06-02 NOTE — Progress Notes (Signed)
FHR difficult to monitor due to early gestation and FM

## 2020-06-02 NOTE — H&P (Signed)
Teresa Perry is a 35 y.o. female presenting for painless vaginal bleeding. Pregnancy complicated by placenta previa. She noted vaginal bleeding and presented to MAU. Initially passed some clots but now small amount on tissue. Denies trauma, no contractions. Has been at PR. OB History    Gravida  7   Para  4   Term  4   Preterm      AB  2   Living  4     SAB      IAB  1   Ectopic      Multiple      Live Births  4          Past Medical History:  Diagnosis Date  . Chlamydia   . No pertinent past medical history   . NVD (normal vaginal delivery) 09/07/2010  . Urinary tract infection    Past Surgical History:  Procedure Laterality Date  . CESAREAN SECTION    . THERAPEUTIC ABORTION     Family History: family history is not on file. Social History:  reports that she has quit smoking. She smoked 0.25 packs per day. She has never used smokeless tobacco. She reports previous alcohol use. She reports that she does not use drugs.     Maternal Diabetes: No Genetic Screening: Normal Maternal Ultrasounds/Referrals: Other: placenta previa Fetal Ultrasounds or other Referrals:  None Maternal Substance Abuse:  No Significant Maternal Medications:  None Significant Maternal Lab Results:  None Other Comments:  None  Review of Systems  Constitutional: Negative for fever.  Eyes: Negative for visual disturbance.  Genitourinary: Positive for vaginal bleeding.  Neurological: Negative for headaches.   History   Blood pressure 121/65, pulse 68, temperature 97.8 F (36.6 C), temperature source Oral, resp. rate 15, height 5\' 6"  (1.676 m), weight 87.5 kg, last menstrual period 12/19/2019, SpO2 100 %. Maternal Exam:  Abdomen: Patient reports no abdominal tenderness.   Fetal Exam Fetal Monitor Review: Pattern: accelerations present.       Physical Exam Cardiovascular:     Rate and Rhythm: Normal rate.  Pulmonary:     Effort: Pulmonary effort is normal.    speculum exam  per NMW in MAU>PELVIC EXAM: Cervix difficult to see due to its position, edges visible, unable to see os.  Moderate to large blood in vault, half clotted.  No active flow visualized  Now uterus soft, NT  Prenatal labs: ABO, Rh: --/--/B POS (05/05 0202) Antibody: NEG (05/05 0202) Rubella:   RPR:    HBsAg:    HIV:    GBS:     U/S preliminary report>Posterior Placenta Previa Fetua transverse, head to right AF WNL Cervical Length 4.7cm  Assessment/Plan: 35 yo G7P4 23 5/7 wks Transverse lie Posterior placenta previa Painless vaginal bleeding-probable marginal placental abruption BMZ series ordered D/W prematurity, transverse lie requiring C/S for delivery Will consult MFM and neonatology IV fluids, close observation   02-18-1974 II 06/02/2020, 7:01 AM

## 2020-06-02 NOTE — Progress Notes (Signed)
Scant dark blood when wipes

## 2020-06-03 MED ORDER — POLYETHYLENE GLYCOL 3350 17 G PO PACK
17.0000 g | PACK | Freq: Every day | ORAL | Status: DC
  Administered 2020-06-03 – 2020-06-10 (×8): 17 g via ORAL
  Filled 2020-06-03 (×8): qty 1

## 2020-06-03 NOTE — Progress Notes (Signed)
Antepartum Progress Note   Teresa Perry is a 35 y.o. female [phone] that is admitted to [facility] for vaginal bleeding in the setting of placenta previa. Today she is [redacted]w[redacted]d.  Overnight, she reports no acute events. Bleeding has slowed significantly since admission. She had some light brown/red discharge yesterday afternoon, nothing overnight. Denies contractions, LOF, and admits to fetal movement.  History   Blood pressure (!) 105/53, pulse 82, temperature 98.1 F (36.7 C), temperature source Oral, resp. rate 18, height 5\' 6"  (1.676 m), weight 87.5 kg, last menstrual period 12/19/2019, SpO2 100 %. Exam  Physical Exam: Gen: Alert, well appearing, no distress Chest: nonlabored breathing CV: no peripheral edema Abdomen: gravid, soft and nontender Ext: no evidence of DVT  FHT: moderate/10x10 accels/no decels. Appropriate for 23w.  Assessment/Plan: . Admitted to [facility] for vaginal bleeding in the setting of previa. [facility] EFM and toco qshift . BMZ 5/5 and 5/6 . Diet: Regular . DVT Ppx: SCDs . S/P MFM and neonatology consults. Marland Kitchen HGB 11.2.  Trend as needed.  Type and screen q 72 hrs. . History of C section with G1, followed by 3 VBAC. Pt aware of mode of delivery for C section until previa resolves.   Marland Kitchen 06/03/2020, 7:22 AM

## 2020-06-04 MED ORDER — LACTATED RINGERS IV BOLUS
500.0000 mL | Freq: Once | INTRAVENOUS | Status: AC
  Administered 2020-06-04: 500 mL via INTRAVENOUS

## 2020-06-04 NOTE — Plan of Care (Signed)
  Problem: Education: Goal: Knowledge of General Education information will improve Description: Including pain rating scale, medication(s)/side effects and non-pharmacologic comfort measures Outcome: Progressing   Problem: Clinical Measurements: Goal: Ability to maintain clinical measurements within normal limits will improve Outcome: Progressing Goal: Cardiovascular complication will be avoided Outcome: Progressing    Problem: Nutrition: Goal: Adequate nutrition will be maintained Outcome: Progressing   Problem: Elimination: Goal: Will not experience complications related to bowel motility Outcome: Progressing   Problem: Pain Managment: Goal: General experience of comfort will improve Outcome: Progressing   Problem: Education: Goal: Knowledge of disease or condition will improve Outcome: Progressing   Problem: Clinical Measurements: Goal: Complications related to the disease process, condition or treatment will be avoided or minimized Outcome: Progressing

## 2020-06-04 NOTE — Progress Notes (Signed)
Antepartum Progress Note   Teresa Perry is a 35 y.o. female [phone] that is admitted to [facility] for vaginal bleeding in the setting of placenta previa. Today she is [redacted]w[redacted]d.  Overnight, she reports no acute events. Bleeding has continued to improve, now just rare spots of brown discharge. She complains of constipation and Miralax was added to her current regimen of colace daily. Denies contractions, LOF, and admits to fetal movement.  History   Blood pressure (!) 112/50, pulse 73, temperature 98.3 F (36.8 C), temperature source Oral, resp. rate 18, height 5\' 6"  (1.676 m), weight 87.5 kg, last menstrual period 12/19/2019, SpO2 98 %. Exam  Physical Exam: Gen: Alert, well appearing, no distress Chest: nonlabored breathing CV: no peripheral edema Abdomen: gravid, soft and nontender Ext: no evidence of DVT  FHT: moderate/10x10 accels/no decels. Appropriate for 23w.  Assessment/Plan: . Admitted to [facility] for vaginal bleeding in the setting of previa. Bleeding improving. [facility] EFM and toco qshift . S/p BMZ 5/5 and 5/6 . Diet: Regular . DVT Ppx: SCDs . S/P MFM and neonatology consults. Marland Kitchen HGB 11.2.  Trend as needed.  Type and screen q 72 hrs., will obtain tomorrow . History of C section with G1, followed by 3 VBAC. Pt aware of mode of delivery for C section until previa resolves.   Marland Kitchen 06/04/2020, 7:44 AM

## 2020-06-04 NOTE — Progress Notes (Signed)
At bedside to check on patient.  Throughout the day, she has not had any bleeding.  She has had two episodes of lower abdominal cramping that is mild, constant. This began after her bowel movement (she has been constipated for several days, using stool softeners).  These have been relieved with tylenol and 500 cc IV fluid bolus.  She is well appearing, uterus is soft, gravid, nontender. No bleeding.  FHT is reactive and reassuring with quiet toco.  Symptoms significantly improved, OK to resume TID monitoring.  Nilda Simmer MD.

## 2020-06-05 LAB — CBC
HCT: 29.7 % — ABNORMAL LOW (ref 36.0–46.0)
Hemoglobin: 9.5 g/dL — ABNORMAL LOW (ref 12.0–15.0)
MCH: 27.5 pg (ref 26.0–34.0)
MCHC: 32 g/dL (ref 30.0–36.0)
MCV: 86.1 fL (ref 80.0–100.0)
Platelets: 253 10*3/uL (ref 150–400)
RBC: 3.45 MIL/uL — ABNORMAL LOW (ref 3.87–5.11)
RDW: 14.4 % (ref 11.5–15.5)
WBC: 7.3 10*3/uL (ref 4.0–10.5)
nRBC: 0 % (ref 0.0–0.2)

## 2020-06-05 LAB — TYPE AND SCREEN
ABO/RH(D): B POS
Antibody Screen: NEGATIVE

## 2020-06-05 MED ORDER — FERROUS SULFATE 325 (65 FE) MG PO TABS
325.0000 mg | ORAL_TABLET | ORAL | Status: DC
  Administered 2020-06-05 – 2020-06-09 (×3): 325 mg via ORAL
  Filled 2020-06-05 (×3): qty 1

## 2020-06-05 NOTE — Progress Notes (Signed)
Antepartum Progress Note  Teresa Perry is a 35 y.o. female [phone] that is admitted to [facility] for vaginal bleeding in the setting of placenta previa. Today she is [redacted]w[redacted]d.  Overnight, she reports no acute events. No bleeding or cramping overnight. Yesterday she had a BM with some discomfort afterwards but has now resolved. Denies contractions, LOF, and admits to fetal movement.  History   Blood pressure (!) 112/56, pulse (!) 59, temperature 97.7 F (36.5 C), temperature source Oral, resp. rate 16, height 5\' 6"  (1.676 m), weight 87.5 kg, last menstrual period 12/19/2019, SpO2 100 %. Exam  Physical Exam: Gen: Alert, well appearing, no distress Chest: nonlabored breathing CV: no peripheral edema Abdomen: gravid, soft and nontender Ext: no evidence of DVT  FHT: moderate/10x10 accels/no decels. Appropriate for 23w.  Assessment/Plan: . Admitted to [facility] for vaginal bleeding in the setting of previa. Bleeding resolved.. . EFM and toco qshift . S/p BMZ 5/5 and 5/6 . Diet: Regular . DVT Ppx: SCDs . S/P MFM and neonatology consults. [facility] HGB 11.2 > 9.5 this AM.  Bleeding stable.  Will start oral Fe. If not tolerated consider Fe infusion.  Type and screen q 72 hrs. . History of C section with G1, followed by 3 VBAC. Pt aware of mode of delivery for C section until previa resolves.  Marland Kitchen 06/05/2020, 8:23 AM

## 2020-06-06 NOTE — Progress Notes (Signed)
Patient ID: Teresa Perry, female   DOB: [birth year], 35 y.o.   MRN: [id number] Antepartum Progress Note  Latanja Cope is a 35 y.o. female [id number] that is admitted to [facility] for vaginal bleeding in the setting of placenta previa. Today she is [redacted]w[redacted]d.    No bleeding since Friday.  Reports GFM, no bleeding, or ROM  VSSAF  FHR 140s no decels ctxs  Abd Gravid nt Neg homans bilaterally  Assessment/Plan:  Admitted to [facility] for vaginal bleeding in the setting of previa. Bleeding resolved..  Plan in patient until 5-7 d no bleeding.  Today D3 since last bleed (Fri 06/03/20)  EFM and toco qshift  S/p BMZ 5/5 and 5/6  Diet: Regular  DVT Ppx: SCDs  S/P MFM and neonatology consults.  HGB 11.2 > 9.5 on 06/05/20 Bleeding stable.  Will start oral Fe. If not tolerated consider Fe infusion.  Type and screen q 72 hrs.  History of C section with G1, followed by 3 VBAC. Pt aware of mode of delivery for C section until previa resolves.

## 2020-06-07 MED ORDER — DOCUSATE SODIUM 100 MG PO CAPS
100.0000 mg | ORAL_CAPSULE | Freq: Two times a day (BID) | ORAL | Status: DC
  Administered 2020-06-07 – 2020-06-10 (×7): 100 mg via ORAL
  Filled 2020-06-07 (×8): qty 1

## 2020-06-07 NOTE — Progress Notes (Signed)
No bleeding, no contractions Tolerating oral iron. C/O constipation x 3 days-no relief with Mirilax  Today's Vitals   06/06/20 2017 06/06/20 2025 06/06/20 2313 06/07/20 0333  BP: 110/64  (!) 104/50 (!) 113/52  Pulse: 84  74 (!) 57  Resp: 18  16 16   Temp: 97.8 F (36.6 C)  98 F (36.7 C) 98 F (36.7 C)  TempSrc: Oral  Oral Oral  SpO2: 100%  100% 100%  Weight:      Height:      PainSc:  0-No pain     Body mass index is 31.15 kg/m.   Abdomen soft, NT  FHT accels+   A/P: Admitted to [facility] for vaginal bleeding in the setting of previa. Bleedingresolved..  Plan in patient until 5-7 d no bleeding.  Today D4 since last bleed (Fri 06/03/20)  EFM and toco qshift  S/p BMZ 5/5 and 5/6  Diet: Regular  DVT Ppx: SCDs  S/P MFM and neonatology consults.  HGB 11.2>9.5 on 5/8/22Bleeding stable. Will start oral Fe. If not tolerated consider Fe infusion. Type and screen q 72 hrs.  History of C section with G1, followed by 3 VBAC. Pt aware of mode of delivery for C section until previa resolves

## 2020-06-08 LAB — TYPE AND SCREEN
ABO/RH(D): B POS
Antibody Screen: NEGATIVE

## 2020-06-08 MED ORDER — METRONIDAZOLE 500 MG PO TABS
500.0000 mg | ORAL_TABLET | Freq: Two times a day (BID) | ORAL | Status: DC
  Administered 2020-06-08 – 2020-06-10 (×5): 500 mg via ORAL
  Filled 2020-06-08 (×5): qty 1

## 2020-06-08 NOTE — Progress Notes (Signed)
Patient doing well. No bleeding.  Good Fetal movement.  BP (!) 110/44 (BP Location: Right Arm)   Pulse 69   Temp 98.2 F (36.8 C) (Oral)   Resp 18   Ht 5\' 6"  (1.676 m)   Wt 87.5 kg   LMP 12/19/2019   SpO2 98%   BMI 31.15 kg/m  Results for orders placed or performed during the hospital encounter of 06/01/20 (from the past 24 hour(s))  Type and screen [facility]     Status: None   Collection Time: 06/08/20  5:19 AM  Result Value Ref Range   ABO/RH(D) B POS    Antibody Screen NEG    Sample Expiration      06/11/2020,2359 Performed at [facility], [address], [place], [place]    Abdomen is soft   IMPRESSION: IUP at 24 w 4 days  Placenta previa Previous Cesarean Section  PLAN: No VB since Friday Continue expectant management in hospital til possibly Friday and then discharge home

## 2020-06-09 MED ORDER — SODIUM CHLORIDE 0.9% FLUSH
3.0000 mL | Freq: Two times a day (BID) | INTRAVENOUS | Status: DC
  Administered 2020-06-09 – 2020-06-10 (×3): 3 mL via INTRAVENOUS

## 2020-06-09 NOTE — Progress Notes (Signed)
S: No c/o. Denies bleeding and pain. +FM. Would like to go home tomorrow.   O:  Vitals:   06/09/20 0549 06/09/20 0819  BP: (!) 107/54 (!) 108/58  Pulse: 69 84  Resp: 16 16  Temp: 98 F (36.7 C) 98.4 F (36.9 C)  SpO2: 100% 100%   NAD, A&O NWOB Abd soft, nondistended, gravid No blood or discharge on underwear  A/P: 34yo [id number] @ 24.5 wga admitted with bleeding in the setting of a known posterior placenta previa. Today is day 6 of no bleeding.  - stable anemia - BMZ complete - NST q shift - for rCS given previa - Anticipate d/c home tomorrow as long as no further bleeding   Belva Agee MD

## 2020-06-10 NOTE — Discharge Instructions (Signed)
No vaginal entry No heavy lifting Call office for appointment at end of next week Immediately go to hospital for any bright red vaginal bleeding

## 2020-06-10 NOTE — Discharge Summary (Signed)
Physician Discharge Summary  Patient ID: Teresa Perry MRN: [id number] DOB/AGE: November 01, 1985 35 y.o.  Admit date: 06/01/2020 Discharge date: 06/10/2020  Admission Diagnoses:placenta previa Vaginal bleeding  Discharge Diagnoses:  Active Problems:   Vaginal bleeding in pregnant patient after first trimester with placenta previa   Discharged Condition: good  Hospital Course: Admitted with painless vaginal bleeding with known posterior placenta previa. Bleeding resolved. She received BMZ series and had MFM and neonatology consults. On day of discharge she has been without any bleeding for 7 days. She lives close to hospital. Follow up in office in 1 week. Instructions reviewed  Consults: MFM, neonatology  Significant Diagnostic Studies: labs:  Results for orders placed or performed during the hospital encounter of 06/01/20 (from the past 72 hour(s))  Type and screen [facility]     Status: None   Collection Time: 06/08/20  5:19 AM  Result Value Ref Range   ABO/RH(D) B POS    Antibody Screen NEG    Sample Expiration      06/11/2020,2359 Performed at [facility], [address], [place], [place] [address]     Treatments: IV hydration  Discharge Exam: Blood pressure (!) 117/55, pulse 83, temperature 98.6 F (37 C), temperature source Oral, resp. rate 18, height 5\' 6"  (1.676 m), weight 87.5 kg, last menstrual period 12/19/2019, SpO2 98 %. Uterus NT  Disposition: Discharge disposition: 01-Home or Self Care        Allergies as of 06/10/2020   No Known Allergies     Medication List    STOP taking these medications   promethazine 25 MG tablet Commonly known as: PHENERGAN     TAKE these medications   prenatal multivitamin Tabs tablet Take 1 tablet by mouth daily at 12 noon.        Signed: 06/12/2020 II 06/10/2020, 2:48 PM

## 2020-06-10 NOTE — Progress Notes (Signed)
No bleeding  Today's Vitals   06/10/20 0820 06/10/20 1000 06/10/20 1138 06/10/20 1320  BP: (!) 117/55     Pulse: 83     Resp: 18     Temp: 98.6 F (37 C)     TempSrc: Oral     SpO2: 98%     Weight:      Height:      PainSc:  0-No pain 0-No pain 0-No pain   Body mass index is 31.15 kg/m.   NST + accels  A/P: Discharge home         Note to work from home for duration of pregnancy given         FU office 1 week         Instructions given

## 2020-06-10 NOTE — Plan of Care (Signed)

## 2020-06-10 NOTE — Progress Notes (Signed)
Discharge instructions have been provided to patient. She has voiced understanding of instructions. She is alert, free of pain, and vaginal bleeding at this time. She has been instructed to schedule a follow up appointment within the next week and to return to hospital if bleeding reappears.

## 2020-06-30 DIAGNOSIS — O4402 Placenta previa specified as without hemorrhage, second trimester: Secondary | ICD-10-CM | POA: Diagnosis not present

## 2020-06-30 DIAGNOSIS — Z3A27 27 weeks gestation of pregnancy: Secondary | ICD-10-CM | POA: Diagnosis not present

## 2020-06-30 DIAGNOSIS — Z3682 Encounter for antenatal screening for nuchal translucency: Secondary | ICD-10-CM | POA: Diagnosis not present

## 2020-06-30 LAB — OB RESULTS CONSOLE HIV ANTIBODY (ROUTINE TESTING): HIV: NONREACTIVE

## 2020-07-15 DIAGNOSIS — O99810 Abnormal glucose complicating pregnancy: Secondary | ICD-10-CM | POA: Diagnosis not present

## 2020-07-15 DIAGNOSIS — Z23 Encounter for immunization: Secondary | ICD-10-CM | POA: Diagnosis not present

## 2020-07-19 ENCOUNTER — Observation Stay (HOSPITAL_COMMUNITY)
Admission: AD | Admit: 2020-07-19 | Discharge: 2020-07-20 | Disposition: A | Discharge status: Home / Self Care | Payer: BC Managed Care – PPO | Attending: Obstetrics and Gynecology | Admitting: Obstetrics and Gynecology

## 2020-07-19 ENCOUNTER — Other Ambulatory Visit: Payer: Self-pay

## 2020-07-19 ENCOUNTER — Encounter (HOSPITAL_COMMUNITY): Payer: Self-pay | Admitting: Obstetrics and Gynecology

## 2020-07-19 ENCOUNTER — Inpatient Hospital Stay (HOSPITAL_BASED_OUTPATIENT_CLINIC_OR_DEPARTMENT_OTHER): Payer: BC Managed Care – PPO

## 2020-07-19 DIAGNOSIS — O289 Unspecified abnormal findings on antenatal screening of mother: Secondary | ICD-10-CM

## 2020-07-19 DIAGNOSIS — Z3A30 30 weeks gestation of pregnancy: Secondary | ICD-10-CM

## 2020-07-19 DIAGNOSIS — O4400 Placenta previa specified as without hemorrhage, unspecified trimester: Secondary | ICD-10-CM | POA: Diagnosis present

## 2020-07-19 DIAGNOSIS — O4703 False labor before 37 completed weeks of gestation, third trimester: Secondary | ICD-10-CM | POA: Diagnosis not present

## 2020-07-19 DIAGNOSIS — O4443 Low lying placenta NOS or without hemorrhage, third trimester: Secondary | ICD-10-CM | POA: Insufficient documentation

## 2020-07-19 DIAGNOSIS — O4403 Placenta previa specified as without hemorrhage, third trimester: Secondary | ICD-10-CM | POA: Diagnosis not present

## 2020-07-19 DIAGNOSIS — Z20822 Contact with and (suspected) exposure to covid-19: Secondary | ICD-10-CM | POA: Insufficient documentation

## 2020-07-19 DIAGNOSIS — Z87891 Personal history of nicotine dependence: Secondary | ICD-10-CM | POA: Insufficient documentation

## 2020-07-19 DIAGNOSIS — O4423 Partial placenta previa NOS or without hemorrhage, third trimester: Secondary | ICD-10-CM | POA: Diagnosis not present

## 2020-07-19 DIAGNOSIS — O322XX0 Maternal care for transverse and oblique lie, not applicable or unspecified: Secondary | ICD-10-CM | POA: Diagnosis not present

## 2020-07-19 LAB — URINALYSIS, ROUTINE W REFLEX MICROSCOPIC
Bilirubin Urine: NEGATIVE
Glucose, UA: NEGATIVE mg/dL
Hgb urine dipstick: NEGATIVE
Ketones, ur: NEGATIVE mg/dL
Leukocytes,Ua: NEGATIVE
Nitrite: NEGATIVE
Protein, ur: NEGATIVE mg/dL
Specific Gravity, Urine: 1.018 (ref 1.005–1.030)
pH: 8 (ref 5.0–8.0)

## 2020-07-19 LAB — RESP PANEL BY RT-PCR (FLU A&B, COVID) ARPGX2
Influenza A by PCR: NEGATIVE
Influenza B by PCR: NEGATIVE
SARS Coronavirus 2 by RT PCR: NEGATIVE

## 2020-07-19 MED ORDER — ACETAMINOPHEN 325 MG PO TABS: 650.0000 mg | ORAL_TABLET | ORAL | Status: DC | PRN

## 2020-07-19 MED ORDER — PRENATAL MULTIVITAMIN CH
1.0000 | ORAL_TABLET | Freq: Every day | ORAL | Status: DC
  Administered 2020-07-20: 1 via ORAL
  Filled 2020-07-19: qty 1

## 2020-07-19 MED ORDER — ZOLPIDEM TARTRATE 5 MG PO TABS: 5.0000 mg | ORAL_TABLET | Freq: Every evening | ORAL | Status: DC | PRN

## 2020-07-19 MED ORDER — LACTATED RINGERS IV SOLN: INTRAVENOUS | Status: DC

## 2020-07-19 MED ORDER — ACETAMINOPHEN 500 MG PO TABS
1000.0000 mg | ORAL_TABLET | Freq: Once | ORAL | Status: AC
  Administered 2020-07-19: 1000 mg via ORAL
  Filled 2020-07-19: qty 2

## 2020-07-19 MED ORDER — DOCUSATE SODIUM 100 MG PO CAPS
100.0000 mg | ORAL_CAPSULE | Freq: Every day | ORAL | Status: DC
  Administered 2020-07-20: 100 mg via ORAL
  Filled 2020-07-19: qty 1

## 2020-07-19 MED ORDER — NIFEDIPINE 10 MG PO CAPS
10.0000 mg | ORAL_CAPSULE | Freq: Four times a day (QID) | ORAL | Status: DC
  Administered 2020-07-19 – 2020-07-20 (×4): 10 mg via ORAL
  Filled 2020-07-19 (×4): qty 1

## 2020-07-19 MED ORDER — CALCIUM CARBONATE ANTACID 500 MG PO CHEW: 2.0000 | CHEWABLE_TABLET | ORAL | Status: DC | PRN

## 2020-07-19 NOTE — H&P (Signed)
Teresa Perry is a 35 y.o. female presenting for uterine contractions. She has a known posterior placenta previa and was admitted at 23.5 weeks with vaginal bleeding. She received BMZ and was managed conservatively until discharge. Today she presented to the office C/O UCs she could feel about every 10 minutes. She denies bleeding, N/V, ROM. She was sent to MAU for further evaluation. OB History     Gravida  7   Para  4   Term  4   Preterm      AB  2   Living  4      SAB      IAB  1   Ectopic      Multiple      Live Births  4          Past Medical History:  Diagnosis Date   Chlamydia    No pertinent past medical history    NVD (normal vaginal delivery) 09/07/2010   Urinary tract infection    Past Surgical History:  Procedure Laterality Date   CESAREAN SECTION     THERAPEUTIC ABORTION     Family History: family history is not on file. Social History:  reports that she has quit smoking. She smoked an average of 0.25 packs per day. She has never used smokeless tobacco. She reports previous alcohol use. She reports that she does not use drugs.     Maternal Diabetes: No Genetic Screening: Normal Maternal Ultrasounds/Referrals: Other:posterior placenta previa Fetal Ultrasounds or other Referrals:  None Maternal Substance Abuse:  No Significant Maternal Medications:  None Significant Maternal Lab Results:  None Other Comments:  None  Review of Systems  Constitutional:  Negative for fever.  Eyes:  Negative for visual disturbance.  Genitourinary:  Negative for vaginal bleeding.  Neurological:  Negative for headaches.  Maternal Medical History:  Fetal activity: Perceived fetal activity is normal.      Blood pressure 134/70, pulse 97, temperature 97.7 F (36.5 C), temperature source Oral, resp. rate 16, last menstrual period 12/19/2019, SpO2 100 %.   Fetal Exam Fetal State Assessment: Category I - tracings are normal.  Physical Exam Cardiovascular:      Rate and Rhythm: Normal rate.  Pulmonary:     Effort: Pulmonary effort is normal.    Speculum exam per CNM>patulous external os, no bleeding  U/S> transverse, posterior previa, no evidence of abruption seen, BPP 8/8, cx length 3.39 cm  FHT cat one UCs q2-4 min, short duration (patient states no change in her perceived UCs of every 10 min)  Prenatal labs: ABO, Rh: --/--/B POS (05/11 6962) Antibody: NEG (05/11 0519) Rubella: Immune (01/26 0000) RPR: Nonreactive (01/26 0000)  HBsAg: Negative (01/26 0000)  HIV:    GBS:     Assessment/Plan: 35 yo G7P4  @ 30 3/7 wks Posterior previa-no bleeding at present Preterm uterine contractions persisting despite oral hydration Will place in observation bed overnight, IV fluids and po nifedipine with continuous FM, CBC, T&S   Teresa Perry II 07/19/2020, 6:30 PM

## 2020-07-19 NOTE — MAU Note (Signed)
Teresa Perry is a 35 y.o. at [redacted]w[redacted]d here in MAU reporting: contractions started this AM around 0600. They have been every 10 min. Thinks they may have slowed down in the last hour. Denies bleeding or LOF. Has a placenta previa/  Onset of complaint: today  Pain score: 6/10  Vitals:   07/19/20 1606  BP: 134/70  Pulse: 97  Resp: 16  Temp: 97.7 F (36.5 C)  SpO2: 100%     FHT:EFM applied in room  Lab orders placed from triage: UA

## 2020-07-19 NOTE — MAU Provider Note (Addendum)
CC:  Chief Complaint  Patient presents with   Contractions     Event Date/Time   First Provider Initiated Contact with Patient 07/19/20 1616      HPI: Teresa Perry is a 35 y.o. year old [id number] female at [redacted]w[redacted]d weeks gestation who presents to MAU reporting contractions every 10 minutes since this morning. Rates pain 6/10.  Has know posterior placenta previa, last [place] 06/30/20. Was hospitalized 06/02/20-06/10/20 for a significant bleeding episode.   Associated Sx:  Vaginal bleeding: Denies Leaking of fluid: Denies Fetal movement: Nml  O: Patient Vitals for the past 24 hrs:  BP Temp Temp src Pulse Resp SpO2  07/19/20 1606 134/70 97.7 F (36.5 C) Oral 97 16 100 %    General: NAD Heart: Regular rate Lungs: Normal rate and effort Abd: Soft, NT, Gravid, S=D Pelvic: NEFG, no pooling, no blood.   Visually 1 cm/thick. Membranes not seen.  Digital exam exam deferred due to previa.   EFM: 145, min-moderate variability, 10x10 acceleration x 1, no decelerations Toco: Contractions every 2-5  minutes, mild  Orders Placed This Encounter  Procedures   [place] MFM OB Limited   US MFM Fetal BPP Wo Non Stress   Urinalysis, Routine w reflex microscopic Urine, Clean Catch   Meds ordered this encounter  Medications   acetaminophen (TYLENOL) tablet 1,000 mg   MAU Course/MDM: - Preterm contractions w/ posterior placenta previa. Cervix visually slightly dilated vs nml multiparous cervix per sterile spec exam. No bleeding. Cervix closed and thick per [place]. UC's decreased per toco w/ PO hydration but pt states they feel the same. Discussed w/ Dr. Alysia Penna. Due to persistent previa pt Hx of previous significant bleed Dr. Alysia Penna recommends deferring to Dr. Georga Kaufmann for POC. Dr. Henderson Cloud will admit pt to [facility] for Obs and tocolysis.   - FHR non-reactive w/ 8/8 BPP  A: [redacted]w[redacted]d week IUP 1. Preterm uterine contractions in third trimester, antepartum   2. Placenta previa antepartum in third trimester      P: Admit to  [facility] Dr. Henderson Cloud to assume care of pt.   Katrinka Blazing, [place], [place] 07/19/2020 6:22 PM

## 2020-07-20 DIAGNOSIS — Z3A30 30 weeks gestation of pregnancy: Secondary | ICD-10-CM | POA: Diagnosis not present

## 2020-07-20 DIAGNOSIS — O4423 Partial placenta previa NOS or without hemorrhage, third trimester: Secondary | ICD-10-CM | POA: Diagnosis not present

## 2020-07-20 LAB — TYPE AND SCREEN
ABO/RH(D): B POS
Antibody Screen: NEGATIVE

## 2020-07-20 LAB — CBC
HCT: 33.1 % — ABNORMAL LOW (ref 36.0–46.0)
Hemoglobin: 10.9 g/dL — ABNORMAL LOW (ref 12.0–15.0)
MCH: 28.1 pg (ref 26.0–34.0)
MCHC: 32.9 g/dL (ref 30.0–36.0)
MCV: 85.3 fL (ref 80.0–100.0)
Platelets: 265 10*3/uL (ref 150–400)
RBC: 3.88 MIL/uL (ref 3.87–5.11)
RDW: 13.9 % (ref 11.5–15.5)
WBC: 6.9 10*3/uL (ref 4.0–10.5)
nRBC: 0 % (ref 0.0–0.2)

## 2020-07-20 MED ORDER — NIFEDIPINE 10 MG PO CAPS: 10.0000 mg | ORAL_CAPSULE | Freq: Four times a day (QID) | ORAL | 0 refills | Status: DC

## 2020-07-20 NOTE — Progress Notes (Addendum)
Patient reports feeling no CTX which is a dramatic improvement from admission.  She feels active FM.    Fetal monitoring Cat I.  Toco flat with rare CTX.  BPP report returned 8/8.  3.39 cm cervical length.  Persistent posterior previa.  Will discharge patient home with Procardia rx prn and f/u in office next week.    Mitchel Honour, DO

## 2020-07-20 NOTE — Discharge Summary (Signed)
Physician Discharge Summary  Patient ID: Genesi Stefanko MRN: [id number] DOB/AGE: Aug 03, 1985 35 y.o.  Admit date: 07/19/2020 Discharge date: 07/20/2020  Admission Diagnoses: Preterm contractions, placenta previa  Discharge Diagnoses: SAA Active Problems:   Placenta previa   Discharged Condition: good  Hospital Course: Patient was admitted on 6/21 after reporting preterm contractions at office visit.  She was started on IV fluids and ordered nifedipine.  U/S was performed which showed persistent previa, BPP 8/8 and cervical length 3 cm.  She had no vaginal bleeding during this admission.  Patient improved with the above measures and on HD2, was discharged home with close follow up in the office.  Consults: None  Significant Diagnostic Studies: radiology: Ultrasound: BPP  Treatments: IV hydration  Discharge Exam: Blood pressure 136/63, pulse 89, temperature 98.2 F (36.8 C), temperature source Oral, resp. rate 18, last menstrual period 12/19/2019, SpO2 94 %. General appearance: alert, cooperative, and appears stated age Extremities: extremities normal, atraumatic, no cyanosis or edema Abd: soft, non-tender  Disposition: Discharge disposition: 01-Home or Self Care        Allergies as of 07/20/2020   No Known Allergies      Medication List     TAKE these medications    NIFEdipine 10 MG capsule Commonly known as: PROCARDIA Take 1 capsule (10 mg total) by mouth every 6 (six) hours.   prenatal multivitamin Tabs tablet Take 1 tablet by mouth daily at 12 noon.         Signed: Mitchel Honour 07/20/2020, 5:44 PM

## 2020-07-20 NOTE — Progress Notes (Signed)
Patient ID: Teresa Perry, female   DOB: May 12, 1985, 35 y.o.   MRN: [id number]  No complaints.  No VB.  Has not felt CTX since last night.  Active FM.    Vitals:   07/20/20 0804 07/20/20 0805  BP: 109/61   Pulse: 81   Resp: 18   Temp: 97.9 F (36.6 C)   SpO2:  98%   Gen: A&O x 3 Abd: soft, NT Ext: no c/c/e  U/S report pending from MAU yesterday.    35yo [id number] at [redacted]w[redacted]d with PTC, previa -F/U U/S report -Continue to monitor CEFM and toco -Continue po nifedipine and IVF -If continues to be stable this pm, will d/c home  Linda Hedges, DO

## 2020-07-20 NOTE — Discharge Instructions (Signed)
Call MD for recurrent contractions or vaginal bleeding.

## 2020-07-29 DIAGNOSIS — O4403 Placenta previa specified as without hemorrhage, third trimester: Secondary | ICD-10-CM | POA: Diagnosis not present

## 2020-07-29 DIAGNOSIS — Z3A31 31 weeks gestation of pregnancy: Secondary | ICD-10-CM | POA: Diagnosis not present

## 2020-08-17 DIAGNOSIS — Z3A34 34 weeks gestation of pregnancy: Secondary | ICD-10-CM | POA: Diagnosis not present

## 2020-08-17 DIAGNOSIS — O4443 Low lying placenta NOS or without hemorrhage, third trimester: Secondary | ICD-10-CM | POA: Diagnosis not present

## 2020-08-31 DIAGNOSIS — Z3685 Encounter for antenatal screening for Streptococcus B: Secondary | ICD-10-CM | POA: Diagnosis not present

## 2020-09-06 DIAGNOSIS — R609 Edema, unspecified: Secondary | ICD-10-CM | POA: Diagnosis not present

## 2020-09-07 NOTE — Patient Instructions (Addendum)
Teresa Perry  09/07/2020   Your procedure is scheduled on:  09/21/2020  Arrive at 0530 at Entrance C on [employer] at [facility]  and [employer]. You are invited to use the FREE valet parking or use the Visitor's parking deck.  Pick up the phone at the desk and dial [phone].  Call this number if you have problems the morning of surgery: [phone]  Remember:   Do not eat food:(After Midnight) Desps de medianoche.  Do not drink clear liquids: (After Midnight) Desps de medianoche.  Take these medicines the morning of surgery with A SIP OF WATER:  Take valtrex as prescribed   Do not wear jewelry, make-up or nail polish.  Do not wear lotions, powders, or perfumes. Do not wear deodorant.  Do not shave 48 hours prior to surgery.  Do not bring valuables to the hospital.  [facility] is not   responsible for any belongings or valuables brought to the hospital.  Contacts, dentures or bridgework may not be worn into surgery.  Leave suitcase in the car. After surgery it may be brought to your room.  For patients admitted to the hospital, checkout time is 11:00 AM the day of              discharge.      Please read over the following fact sheets that you were given:     Preparing for Surgery

## 2020-09-08 ENCOUNTER — Encounter (HOSPITAL_COMMUNITY): Payer: Self-pay

## 2020-09-12 DIAGNOSIS — O43893 Other placental disorders, third trimester: Secondary | ICD-10-CM | POA: Diagnosis not present

## 2020-09-12 DIAGNOSIS — Z3A38 38 weeks gestation of pregnancy: Secondary | ICD-10-CM | POA: Diagnosis not present

## 2020-09-13 ENCOUNTER — Telehealth (HOSPITAL_COMMUNITY): Payer: Self-pay | Admitting: *Deleted

## 2020-09-13 NOTE — Telephone Encounter (Signed)
Preadmission screen  

## 2020-09-16 NOTE — H&P (Signed)
Teresa Perry is a 35 y.o. female presenting for IOL at term. Pregnancy complicated by Hx HSV on Valtrex with no prodromal sxs, LTCS with 3 successful VBACs. OB History     Gravida  7   Para  4   Term  4   Preterm      AB  2   Living  4      SAB      IAB  1   Ectopic      Multiple      Live Births  4          Past Medical History:  Diagnosis Date   Chlamydia    No pertinent past medical history    NVD (normal vaginal delivery) 09/07/2010   Urinary tract infection    Past Surgical History:  Procedure Laterality Date   CESAREAN SECTION     THERAPEUTIC ABORTION     Family History: family history includes Hypertension in her brother. Social History:  reports that she has quit smoking. Her smoking use included cigarettes. She smoked an average of .25 packs per day. She has never used smokeless tobacco. She reports that she does not currently use alcohol. She reports that she does not use drugs.     Maternal Diabetes: No Genetic Screening: Normal Maternal Ultrasounds/Referrals: Normal Fetal Ultrasounds or other Referrals:  None Maternal Substance Abuse:  No Significant Maternal Medications:  None Significant Maternal Lab Results:  Group B Strep positive Other Comments:  None  Review of Systems History   Last menstrual period 12/19/2019. Exam Physical Exam  Prenatal labs: ABO, Rh: --/--/B POS (06/21 2337) Antibody: NEG (06/21 2337) Rubella: Immune (01/26 0000) RPR: Nonreactive (01/26 0000)  HBsAg: Negative (01/26 0000)  HIV:    GBS:   positive 08/31/20  Assessment/Plan: 35 yo G7P4 at term with favorable cx Hx LTCS Infuse PCN for GBBS prophylaxis per protocol 4 hours after infusion will start pitocin -risks have been reviewed  Teresa Perry 09/16/2020, 12:07 PM

## 2020-09-17 ENCOUNTER — Encounter (HOSPITAL_COMMUNITY): Payer: Self-pay | Admitting: Obstetrics and Gynecology

## 2020-09-17 ENCOUNTER — Inpatient Hospital Stay (HOSPITAL_COMMUNITY)
Admission: AD | Admit: 2020-09-17 | Discharge: 2020-09-19 | DRG: 807 | Disposition: A | Discharge status: Home / Self Care | Payer: BC Managed Care – PPO | Attending: Obstetrics and Gynecology | Admitting: Obstetrics and Gynecology

## 2020-09-17 ENCOUNTER — Other Ambulatory Visit: Payer: Self-pay

## 2020-09-17 DIAGNOSIS — I2510 Atherosclerotic heart disease of native coronary artery without angina pectoris: Secondary | ICD-10-CM | POA: Diagnosis not present

## 2020-09-17 DIAGNOSIS — Z3A39 39 weeks gestation of pregnancy: Secondary | ICD-10-CM

## 2020-09-17 DIAGNOSIS — O34211 Maternal care for low transverse scar from previous cesarean delivery: Secondary | ICD-10-CM | POA: Diagnosis not present

## 2020-09-17 DIAGNOSIS — Z20822 Contact with and (suspected) exposure to covid-19: Secondary | ICD-10-CM | POA: Diagnosis not present

## 2020-09-17 DIAGNOSIS — O99824 Streptococcus B carrier state complicating childbirth: Secondary | ICD-10-CM | POA: Diagnosis present

## 2020-09-17 DIAGNOSIS — O9942 Diseases of the circulatory system complicating childbirth: Secondary | ICD-10-CM | POA: Diagnosis not present

## 2020-09-17 DIAGNOSIS — O99820 Streptococcus B carrier state complicating pregnancy: Secondary | ICD-10-CM | POA: Diagnosis not present

## 2020-09-17 DIAGNOSIS — Z87891 Personal history of nicotine dependence: Secondary | ICD-10-CM | POA: Diagnosis not present

## 2020-09-17 DIAGNOSIS — O26893 Other specified pregnancy related conditions, third trimester: Secondary | ICD-10-CM | POA: Diagnosis not present

## 2020-09-17 DIAGNOSIS — O4410 Placenta previa with hemorrhage, unspecified trimester: Secondary | ICD-10-CM | POA: Diagnosis not present

## 2020-09-17 DIAGNOSIS — Z3490 Encounter for supervision of normal pregnancy, unspecified, unspecified trimester: Secondary | ICD-10-CM

## 2020-09-17 LAB — CBC
HCT: 36.1 % (ref 36.0–46.0)
Hemoglobin: 11.6 g/dL — ABNORMAL LOW (ref 12.0–15.0)
MCH: 27.6 pg (ref 26.0–34.0)
MCHC: 32.1 g/dL (ref 30.0–36.0)
MCV: 86 fL (ref 80.0–100.0)
Platelets: 262 10*3/uL (ref 150–400)
RBC: 4.2 MIL/uL (ref 3.87–5.11)
RDW: 14.2 % (ref 11.5–15.5)
WBC: 5.6 10*3/uL (ref 4.0–10.5)
nRBC: 0 % (ref 0.0–0.2)

## 2020-09-17 LAB — RESP PANEL BY RT-PCR (FLU A&B, COVID) ARPGX2
Influenza A by PCR: NEGATIVE
Influenza B by PCR: NEGATIVE
SARS Coronavirus 2 by RT PCR: NEGATIVE

## 2020-09-17 LAB — COMPREHENSIVE METABOLIC PANEL
ALT: 20 U/L (ref 0–44)
AST: 35 U/L (ref 15–41)
Albumin: 3.1 g/dL — ABNORMAL LOW (ref 3.5–5.0)
Alkaline Phosphatase: 101 U/L (ref 38–126)
Anion gap: 8 (ref 5–15)
BUN: 5 mg/dL — ABNORMAL LOW (ref 6–20)
CO2: 22 mmol/L (ref 22–32)
Calcium: 9.8 mg/dL (ref 8.9–10.3)
Chloride: 104 mmol/L (ref 98–111)
Creatinine, Ser: 0.59 mg/dL (ref 0.44–1.00)
GFR, Estimated: >60 mL/min (ref >60)
Glucose, Bld: 83 mg/dL (ref 70–99)
Potassium: 4.2 mmol/L (ref 3.5–5.1)
Sodium: 134 mmol/L — ABNORMAL LOW (ref 135–145)
Total Bilirubin: 0.6 mg/dL (ref 0.3–1.2)
Total Protein: 6.7 g/dL (ref 6.5–8.1)

## 2020-09-17 LAB — PROTEIN / CREATININE RATIO, URINE
Creatinine, Urine: 56.91 mg/dL
Protein Creatinine Ratio: 0.16 mg/mg{Cre} — ABNORMAL HIGH (ref 0.00–0.15)
Total Protein, Urine: 9 mg/dL

## 2020-09-17 LAB — TYPE AND SCREEN
ABO/RH(D): B POS
Antibody Screen: NEGATIVE

## 2020-09-17 MED ORDER — LACTATED RINGERS IV SOLN: 500.0000 mL | INTRAVENOUS | Status: DC | PRN

## 2020-09-17 MED ORDER — ACETAMINOPHEN 325 MG PO TABS: 650.0000 mg | ORAL_TABLET | ORAL | Status: DC | PRN

## 2020-09-17 MED ORDER — ONDANSETRON HCL 4 MG/2ML IJ SOLN: 4.0000 mg | Freq: Four times a day (QID) | INTRAMUSCULAR | Status: DC | PRN

## 2020-09-17 MED ORDER — EPHEDRINE 5 MG/ML INJ: 10.0000 mg | INTRAVENOUS | Status: DC | PRN

## 2020-09-17 MED ORDER — SOD CITRATE-CITRIC ACID 500-334 MG/5ML PO SOLN: 30.0000 mL | ORAL | Status: DC | PRN

## 2020-09-17 MED ORDER — OXYTOCIN-SODIUM CHLORIDE 30-0.9 UT/500ML-% IV SOLN
2.5000 [IU]/h | INTRAVENOUS | Status: DC
  Filled 2020-09-17: qty 500

## 2020-09-17 MED ORDER — LIDOCAINE HCL (PF) 1 % IJ SOLN: 30.0000 mL | INTRAMUSCULAR | Status: DC | PRN

## 2020-09-17 MED ORDER — DIPHENHYDRAMINE HCL 50 MG/ML IJ SOLN: 12.5000 mg | INTRAMUSCULAR | Status: DC | PRN

## 2020-09-17 MED ORDER — HYDROXYZINE HCL 50 MG PO TABS: 50.0000 mg | ORAL_TABLET | Freq: Four times a day (QID) | ORAL | Status: DC | PRN

## 2020-09-17 MED ORDER — TERBUTALINE SULFATE 1 MG/ML IJ SOLN
0.2500 mg | Freq: Once | INTRAMUSCULAR | Status: DC | PRN
Start: 2020-09-17 — End: 2020-09-18

## 2020-09-17 MED ORDER — BUTORPHANOL TARTRATE 1 MG/ML IJ SOLN
1.0000 mg | INTRAMUSCULAR | Status: DC | PRN
Start: 2020-09-17 — End: 2020-09-18
  Administered 2020-09-18: 1 mg via INTRAVENOUS
  Filled 2020-09-17: qty 1

## 2020-09-17 MED ORDER — LACTATED RINGERS IV SOLN: 500.0000 mL | Freq: Once | INTRAVENOUS | Status: DC

## 2020-09-17 MED ORDER — OXYTOCIN BOLUS FROM INFUSION
333.0000 mL | Freq: Once | INTRAVENOUS | Status: AC
  Administered 2020-09-18: 333 mL via INTRAVENOUS

## 2020-09-17 MED ORDER — PHENYLEPHRINE 40 MCG/ML (10ML) SYRINGE FOR IV PUSH (FOR BLOOD PRESSURE SUPPORT): 80.0000 ug | PREFILLED_SYRINGE | INTRAVENOUS | Status: DC | PRN

## 2020-09-17 MED ORDER — LACTATED RINGERS IV SOLN: INTRAVENOUS | Status: DC

## 2020-09-17 MED ORDER — OXYTOCIN-SODIUM CHLORIDE 30-0.9 UT/500ML-% IV SOLN
1.0000 m[IU]/min | INTRAVENOUS | Status: DC
  Administered 2020-09-17: 2 m[IU]/min via INTRAVENOUS

## 2020-09-17 MED ORDER — FENTANYL-BUPIVACAINE-NACL 0.5-0.125-0.9 MG/250ML-% EP SOLN
12.0000 mL/h | EPIDURAL | Status: DC | PRN
  Administered 2020-09-18: 12 mL/h via EPIDURAL
  Filled 2020-09-17: qty 250

## 2020-09-17 MED ORDER — PHENYLEPHRINE 40 MCG/ML (10ML) SYRINGE FOR IV PUSH (FOR BLOOD PRESSURE SUPPORT)
80.0000 ug | PREFILLED_SYRINGE | INTRAVENOUS | Status: DC | PRN
Start: 2020-09-17 — End: 2020-09-18

## 2020-09-17 MED ORDER — SODIUM CHLORIDE 0.9 % IV SOLN
5.0000 10*6.[IU] | Freq: Once | INTRAVENOUS | Status: AC
  Administered 2020-09-17: 5 10*6.[IU] via INTRAVENOUS
  Filled 2020-09-17: qty 5

## 2020-09-17 MED ORDER — OXYCODONE-ACETAMINOPHEN 5-325 MG PO TABS: 2.0000 | ORAL_TABLET | ORAL | Status: DC | PRN

## 2020-09-17 MED ORDER — PENICILLIN G POT IN DEXTROSE 60000 UNIT/ML IV SOLN
3.0000 10*6.[IU] | INTRAVENOUS | Status: DC
Start: 2020-09-18 — End: 2020-09-18
  Administered 2020-09-18 (×2): 3 10*6.[IU] via INTRAVENOUS
  Filled 2020-09-17: qty 50

## 2020-09-17 MED ORDER — OXYCODONE-ACETAMINOPHEN 5-325 MG PO TABS: 1.0000 | ORAL_TABLET | ORAL | Status: DC | PRN

## 2020-09-17 NOTE — MAU Note (Signed)
Teresa Perry is a 35 y.o. at [redacted]w[redacted]d here in MAU reporting: LOF since about 1400 this afternoon. Fluid is clear. States fluid has been trickling and she has been wearing a panty liner. No bleeding. Having irregular contractions. +FM  Onset of complaint: today  Pain score: 0/10  Vitals:   09/17/20 1737  BP: 134/70  Pulse: 83  Resp: 16  Temp: 98.3 F (36.8 C)  SpO2: 100%     FHT:142  Lab orders placed from triage: none

## 2020-09-17 NOTE — H&P (Addendum)
OB History and Physical   Teresa Perry is a 35 y.o. female [phone] presenting for SROM at [redacted]w[redacted]d.  Pregnancy course is notable for history of C section in 2006 with 3x successful VBAC.  Current pregnancy is notable for placenta previa that became low lying placenta, which then resolved. She had an episode of vaginal bleeding at 23 weeks and received BMZ.  GBS is positive. She is on Ppx valtrex for history of HSV with no active lesions.    OB History     Gravida  7   Para  4   Term  4   Preterm      AB  2   Living  4      SAB      IAB  1   Ectopic      Multiple      Live Births  4          Past Medical History:  Diagnosis Date   Chlamydia    No pertinent past medical history    NVD (normal vaginal delivery) 09/07/2010   Urinary tract infection    Past Surgical History:  Procedure Laterality Date   CESAREAN SECTION     THERAPEUTIC ABORTION     Family History: family history includes Hypertension in her brother. Social History:  reports that she has quit smoking. Her smoking use included cigarettes. She smoked an average of .25 packs per day. She has never used smokeless tobacco. She reports that she does not currently use alcohol. She reports that she does not use drugs.     Maternal Diabetes: No Genetic Screening: Normal Maternal Ultrasounds/Referrals: Placenta previa (posterior), now resolved.  Fetal Ultrasounds or other Referrals:  None Maternal Substance Abuse:  No Significant Maternal Medications: prophylactic valtrex  Significant Maternal Lab Results:  Group B Strep positive Other Comments:  None  Review of Systems denies fever, chills, SOB, CP. Had diarrhea earlier today.  History   Blood pressure (!) 141/71, pulse 87, temperature 98.3 F (36.8 C), temperature source Oral, resp. rate 16, height 5\' 6"  (1.676 m), weight 101.8 kg, last menstrual period 12/19/2019, SpO2 100 %. Exam Physical Exam  Gen: alert, well appearing, no  distress Chest: nonlabored breathing CV: no peripheral edema Abdomen: gravid, nontender Ext: no evidence of DVT  Prenatal labs: ABO, Rh: --/--/B POS (06/21 2337) Antibody: NEG (06/21 2337) Rubella: Immune (01/26 0000) RPR: Nonreactive (01/26 0000)  HBsAg: Negative (01/26 0000)  HIV:    GBS:     Assessment/Plan: Admit to Labor and Delivery Prior LTCS with 3x successful VBACs Placenta previa resolved. OK for TOLAC GBS positive - ensure adequate coverage prior to pitocin infusion given dilation and multip status. Extensive discussion in office regarding risks/benefits of TOLAC, including that of uterine rupture.  Discussed again and patient wishes to proceed with TOLAC.  Mild range BP noted in MAU, will include PIH labs Epidural when desired   09-25-1978 09/17/2020, 6:24 PM

## 2020-09-17 NOTE — MAU Provider Note (Signed)
S: Ms. Teresa Perry is a 35 y.o. [id number] at [redacted]w[redacted]d  who presents to MAU today complaining of leaking of fluid since 1300. She denies vaginal bleeding. She denies contractions. She reports normal fetal movement.    O: BP 134/70 (BP Location: Right Arm)   Pulse 83   Temp 98.3 F (36.8 C) (Oral)   Resp 16   Ht 5\' 6"  (1.676 m)   Wt 101.8 kg   LMP 12/19/2019   SpO2 100% Comment: ROOM AIR  BMI 36.22 kg/m  GENERAL: Well-developed, well-nourished female in no acute distress.  HEAD: Normocephalic, atraumatic.  CHEST: Normal effort of breathing, regular heart rate ABDOMEN: Soft, nontender, gravid PELVIC: Normal external female genitalia. Vagina is pink and rugated. Cervix with normal contour, no lesions. Normal discharge.  Positive pooling.   Cervical exam: deferred   Fetal Monitoring: Baseline: 135 Variability: moderate Accelerations: 15x15 Decelerations: none Contractions: 1 UC  Pt informed that the ultrasound is considered a limited OB ultrasound and is not intended to be a complete ultrasound exam.  Patient also informed that the ultrasound is not being completed with the intent of assessing for fetal or placental anomalies or any pelvic abnormalities.  Explained that the purpose of today's ultrasound is to assess for  presentation.  Patient acknowledges the purpose of the exam and the limitations of the study.    Vertex presentation confirmed  A: [facility]UP at [redacted]w[redacted]d  SROM  P: Report given to RN to contact MD on call for further instructions  [redacted]w[redacted]d, CNM 09/17/2020 6:04 PM

## 2020-09-18 ENCOUNTER — Inpatient Hospital Stay (HOSPITAL_COMMUNITY): Payer: BC Managed Care – PPO | Admitting: Anesthesiology

## 2020-09-18 ENCOUNTER — Encounter (HOSPITAL_COMMUNITY): Payer: Self-pay | Admitting: Obstetrics and Gynecology

## 2020-09-18 LAB — RPR: RPR Ser Ql: NONREACTIVE

## 2020-09-18 MED ORDER — SIMETHICONE 80 MG PO CHEW: 80.0000 mg | CHEWABLE_TABLET | ORAL | Status: DC | PRN

## 2020-09-18 MED ORDER — FENTANYL CITRATE (PF) 100 MCG/2ML IJ SOLN
INTRAMUSCULAR | Status: AC
  Administered 2020-09-18: 100 ug via EPIDURAL
  Filled 2020-09-18: qty 2

## 2020-09-18 MED ORDER — SENNOSIDES-DOCUSATE SODIUM 8.6-50 MG PO TABS: 2.0000 | ORAL_TABLET | ORAL | Status: DC

## 2020-09-18 MED ORDER — DIPHENHYDRAMINE HCL 25 MG PO CAPS: 25.0000 mg | ORAL_CAPSULE | Freq: Four times a day (QID) | ORAL | Status: DC | PRN

## 2020-09-18 MED ORDER — TETANUS-DIPHTH-ACELL PERTUSSIS 5-2.5-18.5 LF-MCG/0.5 IM SUSY: 0.5000 mL | PREFILLED_SYRINGE | Freq: Once | INTRAMUSCULAR | Status: DC

## 2020-09-18 MED ORDER — SODIUM BICARBONATE 8.4 % IV SOLN
INTRAVENOUS | Status: DC | PRN
  Administered 2020-09-18: 6 mL via EPIDURAL

## 2020-09-18 MED ORDER — LIDOCAINE HCL (PF) 1 % IJ SOLN
INTRAMUSCULAR | Status: DC | PRN
  Administered 2020-09-18: 8 mL via EPIDURAL

## 2020-09-18 MED ORDER — COCONUT OIL OIL: 1.0000 "application " | TOPICAL_OIL | Status: DC | PRN

## 2020-09-18 MED ORDER — FENTANYL CITRATE (PF) 100 MCG/2ML IJ SOLN
INTRAMUSCULAR | Status: DC | PRN
  Administered 2020-09-18: 100 ug via EPIDURAL

## 2020-09-18 MED ORDER — DIBUCAINE (PERIANAL) 1 % EX OINT: 1.0000 "application " | TOPICAL_OINTMENT | CUTANEOUS | Status: DC | PRN

## 2020-09-18 MED ORDER — IBUPROFEN 600 MG PO TABS
600.0000 mg | ORAL_TABLET | Freq: Four times a day (QID) | ORAL | Status: DC
  Administered 2020-09-18 (×3): 600 mg via ORAL
  Filled 2020-09-18 (×2): qty 1

## 2020-09-18 MED ORDER — ZOLPIDEM TARTRATE 5 MG PO TABS: 5.0000 mg | ORAL_TABLET | Freq: Every evening | ORAL | Status: DC | PRN

## 2020-09-18 MED ORDER — PRENATAL MULTIVITAMIN CH
1.0000 | ORAL_TABLET | Freq: Every day | ORAL | Status: DC
  Administered 2020-09-19: 1 via ORAL
  Filled 2020-09-18: qty 1

## 2020-09-18 MED ORDER — FENTANYL-BUPIVACAINE-NACL 0.5-0.125-0.9 MG/250ML-% EP SOLN: 12.0000 mL/h | EPIDURAL | Status: DC | PRN

## 2020-09-18 MED ORDER — WITCH HAZEL-GLYCERIN EX PADS: 1.0000 "application " | MEDICATED_PAD | CUTANEOUS | Status: DC | PRN

## 2020-09-18 MED ORDER — BENZOCAINE-MENTHOL 20-0.5 % EX AERO
1.0000 "application " | INHALATION_SPRAY | CUTANEOUS | Status: DC | PRN
  Administered 2020-09-18: 1 via TOPICAL
  Filled 2020-09-18: qty 56

## 2020-09-18 MED ORDER — DIPHENHYDRAMINE HCL 50 MG/ML IJ SOLN: 12.5000 mg | INTRAMUSCULAR | Status: DC | PRN

## 2020-09-18 MED ORDER — ACETAMINOPHEN 325 MG PO TABS
650.0000 mg | ORAL_TABLET | ORAL | Status: DC | PRN
  Administered 2020-09-18 – 2020-09-19 (×3): 650 mg via ORAL
  Filled 2020-09-18 (×3): qty 2

## 2020-09-18 MED ORDER — ONDANSETRON HCL 4 MG/2ML IJ SOLN: 4.0000 mg | INTRAMUSCULAR | Status: DC | PRN

## 2020-09-18 MED ORDER — ONDANSETRON HCL 4 MG PO TABS: 4.0000 mg | ORAL_TABLET | ORAL | Status: DC | PRN

## 2020-09-18 NOTE — Anesthesia Preprocedure Evaluation (Signed)
Anesthesia Evaluation  Patient identified by MRN, date of birth, ID band Patient awake    Reviewed: Allergy & Precautions, H&P , NPO status , Patient's Chart, lab work & pertinent test results, reviewed documented beta blocker date and time   Airway Mallampati: II  TM Distance: >3 FB Neck ROM: full    Dental no notable dental hx. (+) Teeth Intact, Dental Advisory Given   Pulmonary neg pulmonary ROS, former smoker,    Pulmonary exam normal breath sounds clear to auscultation       Cardiovascular + CAD  negative cardio ROS Normal cardiovascular exam Rhythm:regular Rate:Normal     Neuro/Psych negative neurological ROS  negative psych ROS   GI/Hepatic negative GI ROS, Neg liver ROS,   Endo/Other  negative endocrine ROS  Renal/GU negative Renal ROS  negative genitourinary   Musculoskeletal   Abdominal   Peds  Hematology negative hematology ROS (+)   Anesthesia Other Findings   Reproductive/Obstetrics (+) Pregnancy                             Anesthesia Physical Anesthesia Plan  ASA: 2  Anesthesia Plan: Epidural   Post-op Pain Management:    Induction:   PONV Risk Score and Plan:   Airway Management Planned:   Additional Equipment: None  Intra-op Plan:   Post-operative Plan:   Informed Consent: I have reviewed the patients History and Physical, chart, labs and discussed the procedure including the risks, benefits and alternatives for the proposed anesthesia with the patient or authorized representative who has indicated his/her understanding and acceptance.     Dental Advisory Given  Plan Discussed with: Anesthesiologist  Anesthesia Plan Comments: (Labs checked- platelets confirmed with RN in room. Fetal heart tracing, per RN, reported to be stable enough for sitting procedure. Discussed epidural, and patient consents to the procedure:  included risk of possible  headache,backache, failed block, allergic reaction, and nerve injury. This patient was asked if she had any questions or concerns before the procedure started.)        Anesthesia Quick Evaluation

## 2020-09-18 NOTE — Anesthesia Postprocedure Evaluation (Signed)
Anesthesia Post Note  Patient: Teresa Perry  Procedure(s) Performed: AN AD HOC LABOR EPIDURAL     Patient location during evaluation: Mother Baby Anesthesia Type: Epidural Level of consciousness: awake and alert and oriented Pain management: satisfactory to patient Vital Signs Assessment: post-procedure vital signs reviewed and stable Respiratory status: respiratory function stable Cardiovascular status: stable Postop Assessment: no headache, no backache, epidural receding, patient able to bend at knees, no signs of nausea or vomiting, adequate PO intake and able to ambulate Anesthetic complications: no   No notable events documented.  Last Vitals:  Vitals:   09/18/20 1210 09/18/20 1310  BP: 135/69 125/83  Pulse: 80 (!) 101  Resp: 18 16  Temp: 36.8 C 37.4 C  SpO2:      Last Pain:  Vitals:   09/18/20 1310  TempSrc: Oral  PainSc:    Pain Goal:                   Mixtli Reno

## 2020-09-18 NOTE — Lactation Note (Signed)
This note was copied from a baby's chart. Lactation Consultation Note  Patient Name: Teresa Perry Date: 09/18/2020 Reason for consult: L&D Initial assessment;Term;1st time breastfeeding Age:34 hours   LC Initial L&D Consult:  Visited with family < 1 hour after birth Per mother, RN had assisted with latching.  Baby appears to be latched deeply and mother denies pain with feeding.  This is mother's first time breast feeding and she informed me that she desires to only latch until her milk supply is in fully.  After that she plans to pump some but ultimately formula feed.  Suggested mother inform her M/B RN of her goals when she arrives to room 409.  I will also alert the RN. Reassured parents that lactation services will be available on the M/B unit as needed.  Allowed time for family bonding.  Father and family members present.   Maternal Data Has patient been taught Hand Expression?: No Does the patient have breastfeeding experience prior to this delivery?: No  Feeding Mother's Current Feeding Choice: Breast Milk and Formula  LATCH Score Latch: Grasps breast easily, tongue down, lips flanged, rhythmical sucking.  Audible Swallowing: None  Type of Nipple:  (Not observed; baby latched when I arrived)  Comfort (Breast/Nipple): Soft / non-tender  Hold (Positioning): Assistance needed to correctly position infant at breast and maintain latch. (Per mother, RN assisted with latching)      Lactation Tools Discussed/Used    Interventions Interventions: Skin to skin  Discharge    Consult Status Consult Status: Follow-up from L&D    Aedin Jeansonne R Andrea Colglazier 09/18/2020, 11:12 AM

## 2020-09-18 NOTE — Progress Notes (Signed)
Labor Progress Note  Patient doing well, epidural just placed.  Pitocin currently 4 mU/min.  Brief period of decreased variability to minimal noted following epidural, returned to moderate spontaneously. Accels noted during last cervical check.  Continue current management for TOLAC induction, titrate pitocin as able.   Nilda Simmer MD

## 2020-09-18 NOTE — Anesthesia Procedure Notes (Signed)
Epidural Patient location during procedure: OB Start time: 09/18/2020 4:06 AM End time: 09/18/2020 4:10 AM  Preanesthetic Checklist Completed: patient identified, IV checked, site marked, risks and benefits discussed, surgical consent, monitors and equipment checked, pre-op evaluation and timeout performed  Epidural Patient position: sitting Prep: DuraPrep and site prepped and draped Patient monitoring: continuous pulse ox and blood pressure Approach: midline Location: L4-L5 Injection technique: LOR air  Needle:  Needle type: Tuohy  Needle gauge: 17 G Needle length: 9 cm and 9 Needle insertion depth: 6 cm Catheter type: closed end flexible Catheter size: 19 Gauge Catheter at skin depth: 11 cm Test dose: negative  Assessment Events: blood not aspirated, injection not painful, no injection resistance, no paresthesia and negative IV test

## 2020-09-19 ENCOUNTER — Encounter (HOSPITAL_COMMUNITY)
Admission: RE | Admit: 2020-09-19 | Discharge: 2020-09-19 | Disposition: A | Discharge status: Home / Self Care | Payer: BC Managed Care – PPO | Source: Ambulatory Visit | Attending: Obstetrics and Gynecology | Admitting: Obstetrics and Gynecology

## 2020-09-19 LAB — CBC
HCT: 32.2 % — ABNORMAL LOW (ref 36.0–46.0)
Hemoglobin: 10.3 g/dL — ABNORMAL LOW (ref 12.0–15.0)
MCH: 27.7 pg (ref 26.0–34.0)
MCHC: 32 g/dL (ref 30.0–36.0)
MCV: 86.6 fL (ref 80.0–100.0)
Platelets: 228 10*3/uL (ref 150–400)
RBC: 3.72 MIL/uL — ABNORMAL LOW (ref 3.87–5.11)
RDW: 14.3 % (ref 11.5–15.5)
WBC: 9.3 10*3/uL (ref 4.0–10.5)
nRBC: 0 % (ref 0.0–0.2)

## 2020-09-19 MED ORDER — OXYCODONE HCL 5 MG PO TABS
5.0000 mg | ORAL_TABLET | Freq: Four times a day (QID) | ORAL | Status: DC | PRN
Start: 2020-09-19 — End: 2020-09-19
  Administered 2020-09-19 (×2): 5 mg via ORAL
  Filled 2020-09-19 (×2): qty 1

## 2020-09-19 MED ORDER — IBUPROFEN 600 MG PO TABS: 600.0000 mg | ORAL_TABLET | Freq: Four times a day (QID) | ORAL | 0 refills | Status: AC

## 2020-09-19 NOTE — Progress Notes (Signed)
Post Partum Day 1 Subjective: no complaints, up ad lib, and voiding  Objective: Blood pressure 129/70, pulse 76, temperature 98.2 F (36.8 C), resp. rate 18, height 5\' 6"  (1.676 m), weight 101.8 kg, last menstrual period 12/19/2019, SpO2 100 %, unknown if currently breastfeeding.  Physical Exam:  General: alert, cooperative, and appears stated age Lochia: appropriate Uterine Fundus: firm Incision: n/a DVT Evaluation: No evidence of DVT seen on physical exam. Negative Homan's sign. No cords or calf tenderness.  Recent Labs    09/17/20 1836 09/19/20 0442  HGB 11.6* 10.3*  HCT 36.1 32.2*    Assessment/Plan: Discharge home and Breastfeeding   LOS: 2 days   09/21/20 09/19/2020, 8:43 AM

## 2020-09-19 NOTE — Progress Notes (Signed)
RN called to request oxycodone rx sent for cramping.  Rx oxycodone 5 mg #12 sent.  This rx was sent from [facility] (office EMR) as d/c orders were already completed.  Mitchel Honour, DO

## 2020-09-19 NOTE — Social Work (Signed)
CSW received consult for hx of Anxiety and Depression.  CSW met with MOB to offer support and complete assessment.     CSW introduced self and role. CSW observed infant in bassinet sleeping. CSW informed MOB of reason for consult. MOB was welcoming and pleasant. MOB reported she is doing well. MOB disclosed a history of anxiety and depression, stating she was diagnosed in 2020 during the Covid pandemic. MOB expressed she has not experienced any symptoms recently, and attributed the symptoms to being situational. MOB reported she attended tele-health therapy for two months and was prescribed medication but did not like the side effects. MOB identified a strong support system, consisting of FOB, her mother and in-laws. MOB denies any current SI, HI or being involved in DV. MOB was appropriate in interaction and did not display any acute mental health symptoms.    CSW provided education regarding the baby blues period versus PPD and provided resources. CSW provided the New Mom Checklist and encouraged MOB to self evaluate and contact a medical professional if symptoms are noted at any time.   CSW provided review of Sudden Infant Death Syndrome (SIDS) precautions. MOB reported she has a crib and car seat. MOB identified [facility] for follow-up care and denies any barriers to care. MOB reported she is already enrolled in WIC. MOB denies any additional needs at this time.  CSW identifies no further need for intervention and no barriers to discharge at this time.  Jamareon Shimel, LCSWA Clinical Social Work [facility] [phone] 

## 2020-09-19 NOTE — Discharge Summary (Signed)
Postpartum Discharge Summary    Patient Name: Teresa Perry DOB: 1985/05/04 MRN: [id number]  Date of admission: 09/17/2020 Delivery date:09/18/2020  Delivering provider: Eyvonne Mechanic A  Date of discharge: 09/19/2020  Admitting diagnosis: Pregnancy [Z34.90] Intrauterine pregnancy: [redacted]w[redacted]d     Secondary diagnosis:  Previous Cesarean Delivery  Additional problems: none    Discharge diagnosis: Term Pregnancy Delivered and VBAC                                              Post partum procedures: none Augmentation: Pitocin Complications: None  Hospital course: Onset of Labor With Vaginal Delivery      35 y.o. yo [id number] at [redacted]w[redacted]d was admitted in Latent Labor on 09/17/2020. Patient had an uncomplicated labor course as follows:  Membrane Rupture Time/Date: 1:00 PM ,09/17/2020   Delivery Method:VBAC, Spontaneous  Episiotomy: None  Lacerations:  None  Patient had an uncomplicated postpartum course.  She is ambulating, tolerating a regular diet, passing flatus, and urinating well. Patient is discharged home in stable condition on 09/19/20.  Newborn Data: Birth date:09/18/2020  Birth time:10:20 AM  Gender:Female  Living status:Living  Apgars:9 ,9  Weight:3705 g   Magnesium Sulfate received: No BMZ received: No Rhophylac:No MMR:No T-DaP:Given prenatally Flu: No Transfusion:No  Physical exam  Vitals:   09/18/20 1658 09/18/20 2059 09/19/20 0057 09/19/20 0446  BP: 105/61 121/74 124/69 129/70  Pulse: 87 79 77 76  Resp: $Remo'17 18 20 18  'UMdTe$ Temp: 98.4 F (36.9 C) 98.4 F (36.9 C) 98.4 F (36.9 C) 98.2 F (36.8 C)  TempSrc: Oral  Oral   SpO2:  100% 98% 100%  Weight:      Height:       General: alert, cooperative, and no distress Lochia: appropriate Uterine Fundus: firm Incision: n/a DVT Evaluation: No evidence of DVT seen on physical exam. Negative Homan's sign. No cords or calf tenderness. Labs: Lab Results  Component Value Date   WBC 9.3 09/19/2020   HGB 10.3  (L) 09/19/2020   HCT 32.2 (L) 09/19/2020   MCV 86.6 09/19/2020   PLT 228 09/19/2020   CMP Latest Ref Rng & Units 09/17/2020  Glucose 70 - 99 mg/dL 83  BUN 6 - 20 mg/dL 5(L)  Creatinine 0.44 - 1.00 mg/dL 0.59  Sodium 135 - 145 mmol/L 134(L)  Potassium 3.5 - 5.1 mmol/L 4.2  Chloride 98 - 111 mmol/L 104  CO2 22 - 32 mmol/L 22  Calcium 8.9 - 10.3 mg/dL 9.8  Total Protein 6.5 - 8.1 g/dL 6.7  Total Bilirubin 0.3 - 1.2 mg/dL 0.6  Alkaline Phos 38 - 126 U/L 101  AST 15 - 41 U/L 35  ALT 0 - 44 U/L 20   Edinburgh Score: No flowsheet data found.   After visit meds:  Allergies as of 09/19/2020   No Known Allergies      Medication List     STOP taking these medications    NIFEdipine 10 MG capsule Commonly known as: PROCARDIA   valACYclovir 1000 MG tablet Commonly known as: VALTREX       TAKE these medications    ibuprofen 600 MG tablet Commonly known as: ADVIL Take 1 tablet (600 mg total) by mouth every 6 (six) hours.   prenatal multivitamin Tabs tablet Take 1 tablet by mouth daily at 12 noon.  Discharge home in stable condition Infant Feeding: Breast Infant Disposition:home with mother Discharge instruction: per After Visit Summary and Postpartum booklet. Activity: Advance as tolerated. Pelvic rest for 6 weeks.  Diet: routine diet Future Appointments:No future appointments. Follow up Visit:6 weeks postpartum visit.   09/19/2020 Linda Hedges, DO

## 2020-09-19 NOTE — Discharge Instructions (Signed)
Call MD for T>100.4, heavy vaginal bleeding, severe abdominal pain or respiratory distress.  Call office to schedule postpartum visit in 6 weeks.  Pelvic rest x 6 weeks.   

## 2020-09-20 ENCOUNTER — Inpatient Hospital Stay (HOSPITAL_COMMUNITY): Payer: BC Managed Care – PPO

## 2020-09-20 ENCOUNTER — Inpatient Hospital Stay (HOSPITAL_COMMUNITY)
Admission: AD | Admit: 2020-09-20 | Payer: BC Managed Care – PPO | Source: Home / Self Care | Admitting: Obstetrics and Gynecology

## 2020-09-20 DIAGNOSIS — M5489 Other dorsalgia: Secondary | ICD-10-CM | POA: Diagnosis not present

## 2020-09-20 LAB — SURGICAL PATHOLOGY

## 2020-09-21 ENCOUNTER — Inpatient Hospital Stay (HOSPITAL_COMMUNITY): Admit: 2020-09-21 | Payer: BC Managed Care – PPO | Admitting: Obstetrics and Gynecology

## 2020-09-21 ENCOUNTER — Encounter (HOSPITAL_COMMUNITY): Payer: Self-pay

## 2020-09-21 SURGERY — Surgical Case
Anesthesia: Regional

## 2020-09-28 ENCOUNTER — Telehealth (HOSPITAL_COMMUNITY): Payer: Self-pay | Admitting: *Deleted

## 2020-09-28 NOTE — Telephone Encounter (Signed)
Mom reports feeling good, but still having some cramping. Bleeding has slowed down. No concerns about herself. EPDS=3 (hospital score=8) Mom reports baby is doing well. Feeding, peeing, and pooping without difficulty. No concerns about baby.  Duffy Rhody, RN 09-28-2020 at 2:25p

## 2020-10-31 DIAGNOSIS — Z20822 Contact with and (suspected) exposure to covid-19: Secondary | ICD-10-CM | POA: Diagnosis not present

## 2020-10-31 DIAGNOSIS — R059 Cough, unspecified: Secondary | ICD-10-CM | POA: Diagnosis not present

## 2020-10-31 DIAGNOSIS — Z1389 Encounter for screening for other disorder: Secondary | ICD-10-CM | POA: Diagnosis not present

## 2020-10-31 DIAGNOSIS — J210 Acute bronchiolitis due to respiratory syncytial virus: Secondary | ICD-10-CM | POA: Diagnosis not present

## 2020-11-17 DIAGNOSIS — Z3202 Encounter for pregnancy test, result negative: Secondary | ICD-10-CM | POA: Diagnosis not present

## 2020-11-17 DIAGNOSIS — Z30430 Encounter for insertion of intrauterine contraceptive device: Secondary | ICD-10-CM | POA: Diagnosis not present

## 2021-01-18 DIAGNOSIS — Z3009 Encounter for other general counseling and advice on contraception: Secondary | ICD-10-CM | POA: Diagnosis not present

## 2021-01-18 DIAGNOSIS — Z3040 Encounter for surveillance of contraceptives, unspecified: Secondary | ICD-10-CM | POA: Diagnosis not present

## 2021-01-18 DIAGNOSIS — F53 Postpartum depression: Secondary | ICD-10-CM | POA: Diagnosis not present

## 2021-01-18 DIAGNOSIS — Z7689 Persons encountering health services in other specified circumstances: Secondary | ICD-10-CM | POA: Diagnosis not present

## 2021-02-06 DIAGNOSIS — F331 Major depressive disorder, recurrent, moderate: Secondary | ICD-10-CM | POA: Diagnosis not present

## 2021-02-22 DIAGNOSIS — F331 Major depressive disorder, recurrent, moderate: Secondary | ICD-10-CM | POA: Diagnosis not present

## 2021-03-02 DIAGNOSIS — F331 Major depressive disorder, recurrent, moderate: Secondary | ICD-10-CM | POA: Diagnosis not present

## 2021-03-09 DIAGNOSIS — F331 Major depressive disorder, recurrent, moderate: Secondary | ICD-10-CM | POA: Diagnosis not present

## 2021-03-16 DIAGNOSIS — F331 Major depressive disorder, recurrent, moderate: Secondary | ICD-10-CM | POA: Diagnosis not present

## 2021-03-23 DIAGNOSIS — F331 Major depressive disorder, recurrent, moderate: Secondary | ICD-10-CM | POA: Diagnosis not present

## 2021-04-06 DIAGNOSIS — F331 Major depressive disorder, recurrent, moderate: Secondary | ICD-10-CM | POA: Diagnosis not present

## 2021-04-20 DIAGNOSIS — F331 Major depressive disorder, recurrent, moderate: Secondary | ICD-10-CM | POA: Diagnosis not present

## 2021-05-04 DIAGNOSIS — F331 Major depressive disorder, recurrent, moderate: Secondary | ICD-10-CM | POA: Diagnosis not present

## 2021-05-18 DIAGNOSIS — F331 Major depressive disorder, recurrent, moderate: Secondary | ICD-10-CM | POA: Diagnosis not present

## 2021-06-08 DIAGNOSIS — F331 Major depressive disorder, recurrent, moderate: Secondary | ICD-10-CM | POA: Diagnosis not present

## 2021-07-06 DIAGNOSIS — F331 Major depressive disorder, recurrent, moderate: Secondary | ICD-10-CM | POA: Diagnosis not present

## 2021-07-13 DIAGNOSIS — F331 Major depressive disorder, recurrent, moderate: Secondary | ICD-10-CM | POA: Diagnosis not present

## 2021-07-20 DIAGNOSIS — F331 Major depressive disorder, recurrent, moderate: Secondary | ICD-10-CM | POA: Diagnosis not present

## 2021-08-03 DIAGNOSIS — F331 Major depressive disorder, recurrent, moderate: Secondary | ICD-10-CM | POA: Diagnosis not present

## 2021-08-18 DIAGNOSIS — F331 Major depressive disorder, recurrent, moderate: Secondary | ICD-10-CM | POA: Diagnosis not present

## 2021-10-12 DIAGNOSIS — F331 Major depressive disorder, recurrent, moderate: Secondary | ICD-10-CM | POA: Diagnosis not present

## 2021-10-20 DIAGNOSIS — F331 Major depressive disorder, recurrent, moderate: Secondary | ICD-10-CM | POA: Diagnosis not present

## 2022-02-17 IMAGING — US US MFM FETAL BPP W/O NON-STRESS
1 series · 12 of 28 positions shown · non-contrast
Comparison: none

[Series 1: us mfm fetal bpp w/o non-stress · 43 acquisitions, 12 frames shown]
[im 2/43]
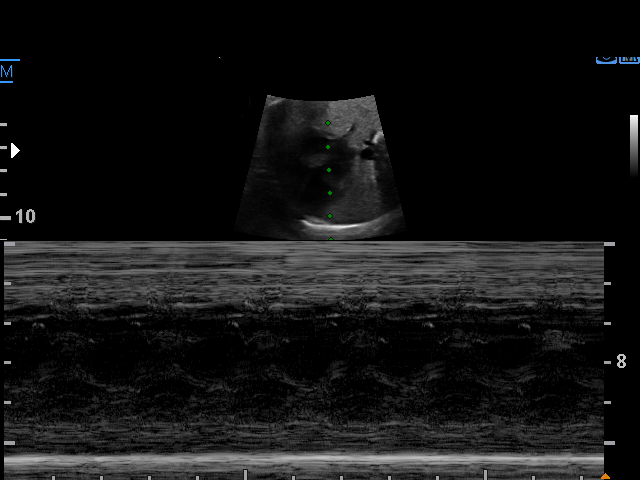
[im 5/43]
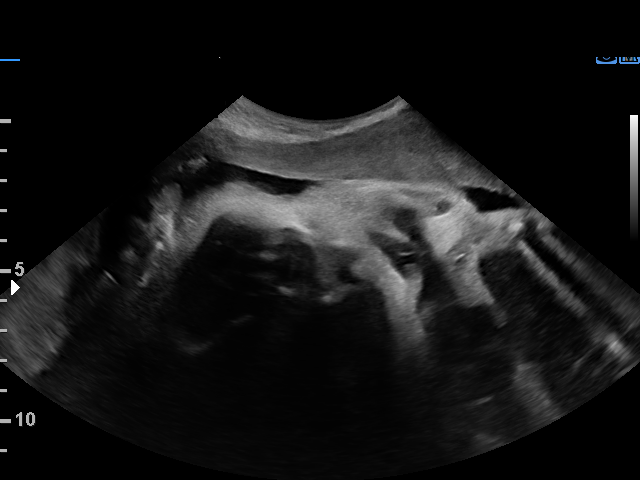
[im 8/43]
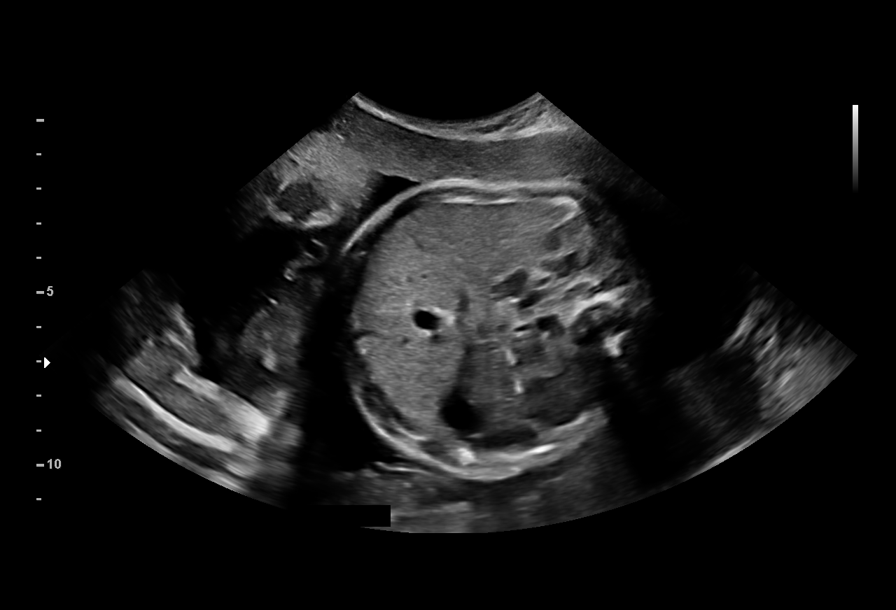
[im 13/43]
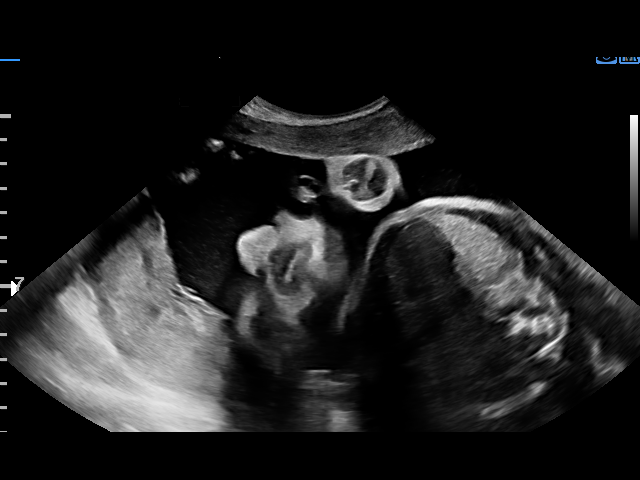
[im 16/43]
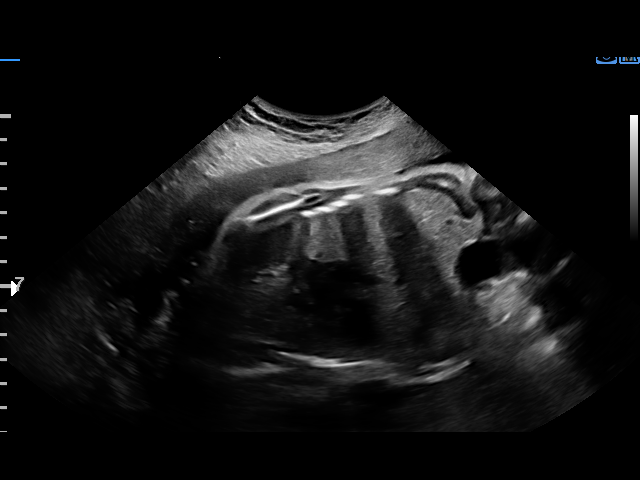
[im 19/43]
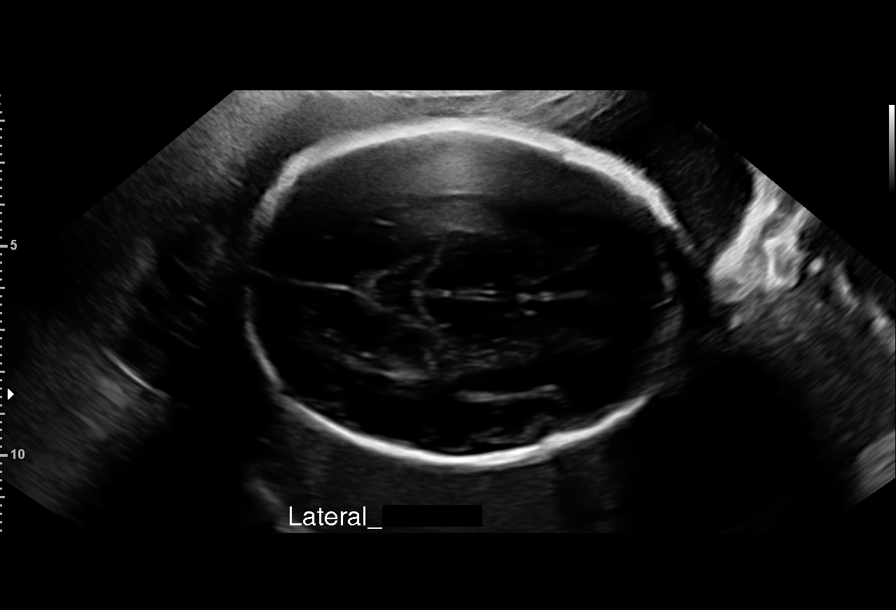
[im 24/43]
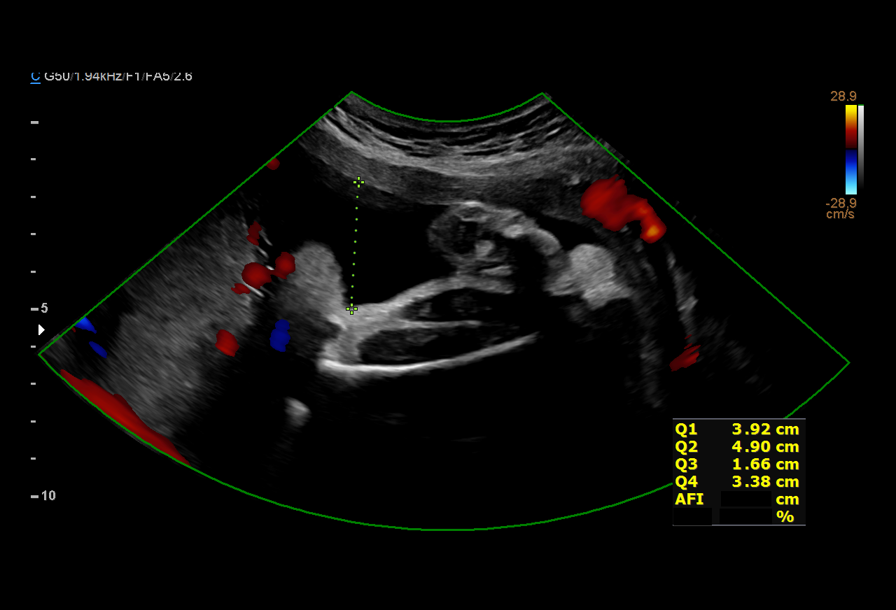
[im 27/43]
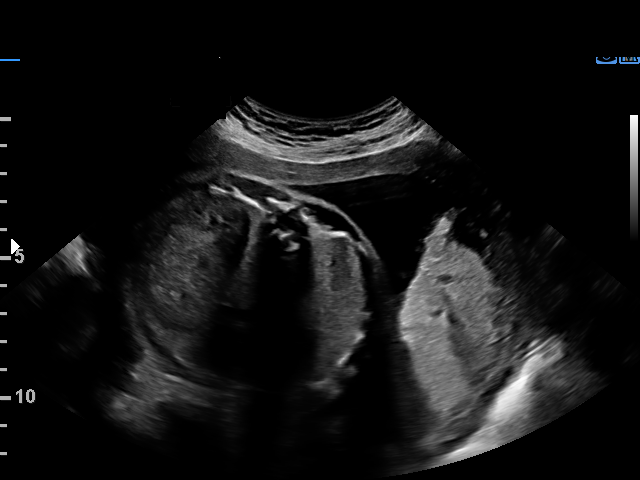
[im 30/43]
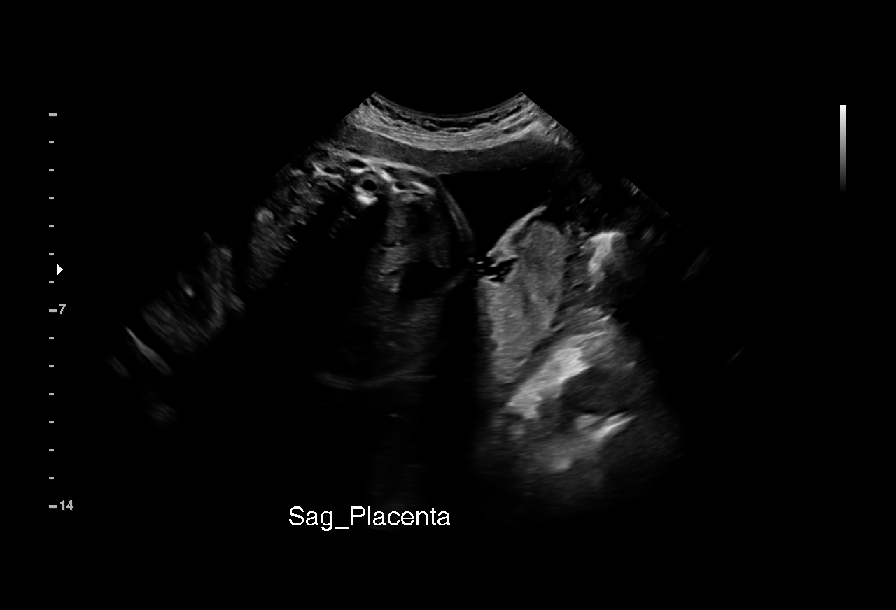
[im 35/43]
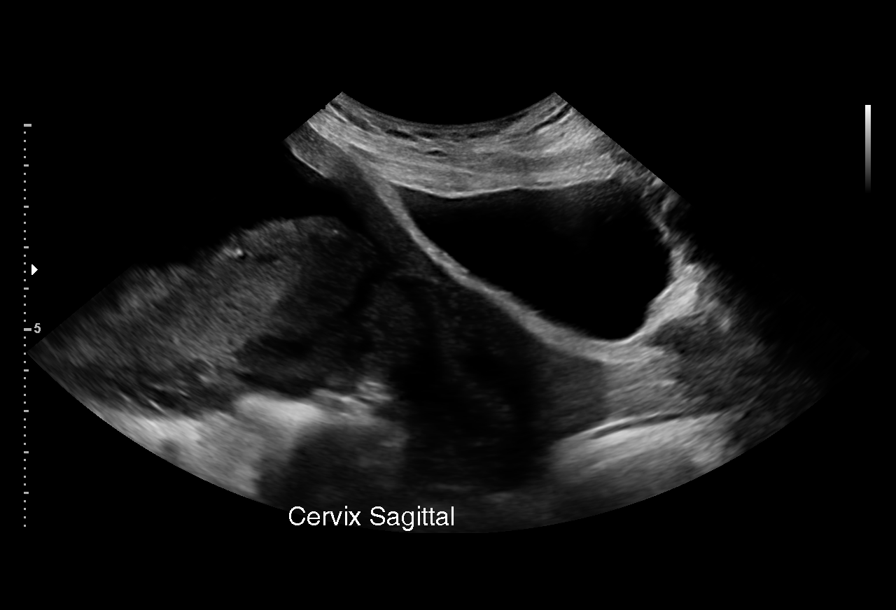
[im 38/43]
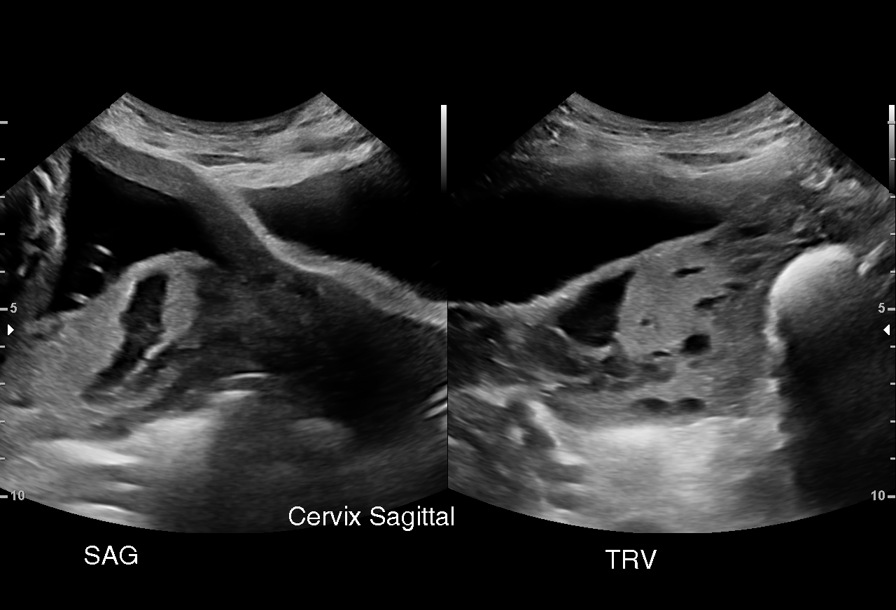
[im 41/43]
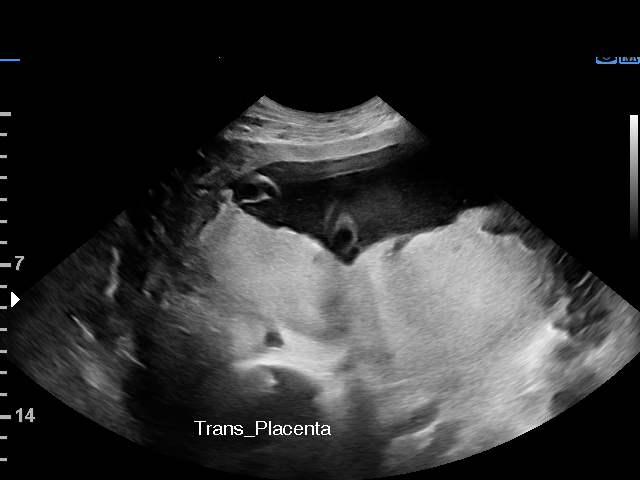

[12 of 28 positions shown; findings below may reference images not displayed]

Attending:        Kadric De      Secondary Phy.:   [facility]
 Referred By:      [facility] MAU/Triage         [facility]  US MFM OB LIMITED                     [id number]    [facility]

Indications

 Preterm contractions
 Non-reactive NST
 30 weeks gestation of pregnancy
Fetal Evaluation

 Num Of Fetuses:         1
 Fetal Heart Rate(bpm):  148
 Cardiac Activity:       Observed
 Presentation:           Transverse, head to maternal right
 Placenta:               Posterior Previa
 P. Cord Insertion:      Not well visualized

 Amniotic Fluid
 AFI FV:      Within normal limits

 AFI Sum(cm)     %Tile       Largest Pocket(cm)
 13.9            45

 RUQ(cm)       RLQ(cm)       LUQ(cm)        LLQ(cm)


 Comment:    No placental abruption identified.
Biophysical Evaluation

 Amniotic F.V:   Pocket => 2 cm             F. Tone:        Observed
 F. Movement:    Observed                   Score:          [DATE]
 F. Breathing:   Observed
Biometry

 LV:        2.4  mm
OB History

 Gravidity:    7         Term:   4        Prem:   0        SAB:   1
 TOP:          1       Ectopic:  0        Living: 4
Gestational Age

 LMP:           30w 3d        Date:  12/19/19                 EDD:   09/24/20
 Best:          30w 3d     Det. By:  LMP  (12/19/19)          EDD:   09/24/20
Anatomy

 Cranium:               Appears normal         Stomach:                Appears normal, left
                                                                       sided
 Ventricles:            Appears normal         Kidneys:                Appear normal
 Thoracic:              Appears normal         Bladder:                Appears normal
 Diaphragm:             Appears normal
Cervix Uterus Adnexa

 Cervix
 Length:           3.39  cm.
 Closed Normal appearance by transabdominal scan.

 Uterus
 No abnormality visualized.

 Right Ovary
 Not visualized.

 Left Ovary
 Not visualized.

 Cul De Sac
 No free fluid seen.

 Adnexa
 No abnormality visualized.
Impression

 Limited exam observed secondary to uterine contractions
 with known posterior placenta
 Biophysical profile [DATE]
 Placenta previa persist.
 Good amniotic fluid and fetal movement observed.
Recommendations

 Continue serial monitoring.
 Repeat growth within 4 weeks from the previous exam.

## 2022-07-26 DIAGNOSIS — F331 Major depressive disorder, recurrent, moderate: Secondary | ICD-10-CM | POA: Diagnosis not present

## 2022-08-03 DIAGNOSIS — F331 Major depressive disorder, recurrent, moderate: Secondary | ICD-10-CM | POA: Diagnosis not present

## 2022-08-10 DIAGNOSIS — F331 Major depressive disorder, recurrent, moderate: Secondary | ICD-10-CM | POA: Diagnosis not present

## 2022-08-17 DIAGNOSIS — F331 Major depressive disorder, recurrent, moderate: Secondary | ICD-10-CM | POA: Diagnosis not present

## 2022-08-24 DIAGNOSIS — F331 Major depressive disorder, recurrent, moderate: Secondary | ICD-10-CM | POA: Diagnosis not present

## 2022-08-31 DIAGNOSIS — F331 Major depressive disorder, recurrent, moderate: Secondary | ICD-10-CM | POA: Diagnosis not present

## 2022-09-07 DIAGNOSIS — F332 Major depressive disorder, recurrent severe without psychotic features: Secondary | ICD-10-CM | POA: Diagnosis not present

## 2022-09-14 DIAGNOSIS — F332 Major depressive disorder, recurrent severe without psychotic features: Secondary | ICD-10-CM | POA: Diagnosis not present

## 2022-09-28 DIAGNOSIS — F411 Generalized anxiety disorder: Secondary | ICD-10-CM | POA: Diagnosis not present
